# Patient Record
Sex: Female | Born: 1995 | Race: Black or African American | Hispanic: No | Marital: Single | Smoking: Never smoker
Health system: Southern US, Community
[De-identification: ages and names within clinical notes are randomized; demographics above are authoritative.]

---
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
## 2018-11-22 ENCOUNTER — Emergency Department
Admission: EM | Admit: 2018-11-22 | Discharge: 2018-11-22 | Disposition: A | Discharge status: Home / Self Care | Payer: Medicaid Other | Attending: Emergency Medicine | Admitting: Emergency Medicine

## 2018-11-22 ENCOUNTER — Other Ambulatory Visit: Payer: Self-pay

## 2018-11-22 ENCOUNTER — Encounter: Payer: Self-pay | Admitting: Emergency Medicine

## 2018-11-22 ENCOUNTER — Emergency Department: Payer: Medicaid Other

## 2018-11-22 DIAGNOSIS — M20011 Mallet finger of right finger(s): Secondary | ICD-10-CM | POA: Insufficient documentation

## 2018-11-22 DIAGNOSIS — M79644 Pain in right finger(s): Secondary | ICD-10-CM | POA: Diagnosis present

## 2018-11-22 MED ORDER — MELOXICAM 15 MG PO TABS: 15.0000 mg | ORAL_TABLET | Freq: Every day | ORAL | 1 refills | Status: AC
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
## 2018-11-22 NOTE — ED Triage Notes (Signed)
Pt concerned over pain to pt's right pinky finger.

## 2019-11-04 ENCOUNTER — Ambulatory Visit (HOSPITAL_COMMUNITY)
Admission: EM | Admit: 2019-11-04 | Discharge: 2019-11-04 | Disposition: A | Discharge status: Home / Self Care | Payer: Medicaid Other

## 2019-11-04 ENCOUNTER — Other Ambulatory Visit: Payer: Self-pay

## 2021-08-09 ENCOUNTER — Encounter: Payer: Self-pay | Admitting: *Deleted

## 2021-08-09 ENCOUNTER — Other Ambulatory Visit: Payer: Self-pay

## 2021-08-09 DIAGNOSIS — Z3A01 Less than 8 weeks gestation of pregnancy: Secondary | ICD-10-CM | POA: Insufficient documentation

## 2021-08-09 DIAGNOSIS — O99281 Endocrine, nutritional and metabolic diseases complicating pregnancy, first trimester: Secondary | ICD-10-CM | POA: Diagnosis not present

## 2021-08-09 DIAGNOSIS — O2341 Unspecified infection of urinary tract in pregnancy, first trimester: Secondary | ICD-10-CM | POA: Insufficient documentation

## 2021-08-09 DIAGNOSIS — O26891 Other specified pregnancy related conditions, first trimester: Secondary | ICD-10-CM | POA: Diagnosis present

## 2021-08-09 DIAGNOSIS — E860 Dehydration: Secondary | ICD-10-CM | POA: Insufficient documentation

## 2021-08-09 DIAGNOSIS — O219 Vomiting of pregnancy, unspecified: Secondary | ICD-10-CM | POA: Diagnosis not present

## 2021-08-09 DIAGNOSIS — R102 Pelvic and perineal pain: Secondary | ICD-10-CM | POA: Diagnosis not present

## 2021-08-09 DIAGNOSIS — R1084 Generalized abdominal pain: Secondary | ICD-10-CM | POA: Diagnosis not present

## 2021-08-09 MED ORDER — ONDANSETRON 4 MG PO TBDP
ORAL_TABLET | ORAL | Status: AC
  Administered 2021-08-10: 4 mg via ORAL
  Filled 2021-08-09: qty 1

## 2021-08-09 MED ORDER — ONDANSETRON 4 MG PO TBDP
4.0000 mg | ORAL_TABLET | Freq: Once | ORAL | Status: AC | PRN
Start: 2021-08-09 — End: 2021-08-10
  Administered 2021-08-09: 4 mg via ORAL

## 2021-08-09 NOTE — ED Notes (Signed)
Pt unable to void at this time. 

## 2021-08-09 NOTE — ED Triage Notes (Signed)
Pt brought in via ems from home with vomiting.  Pt is approx [redacted] weeks pregnant.  Pt has abd pain. No vag bleeding.  No dysuria.  Pt vomiting in triage.  [id number] ?

## 2021-08-10 ENCOUNTER — Emergency Department: Payer: Medicaid Other

## 2021-08-10 ENCOUNTER — Emergency Department
Admission: EM | Admit: 2021-08-10 | Discharge: 2021-08-10 | Disposition: A | Discharge status: Home / Self Care | Payer: Medicaid Other | Attending: Emergency Medicine | Admitting: Emergency Medicine

## 2021-08-10 DIAGNOSIS — R8281 Pyuria: Secondary | ICD-10-CM

## 2021-08-10 DIAGNOSIS — O26891 Other specified pregnancy related conditions, first trimester: Secondary | ICD-10-CM

## 2021-08-10 DIAGNOSIS — R112 Nausea with vomiting, unspecified: Secondary | ICD-10-CM

## 2021-08-10 LAB — URINALYSIS, ROUTINE W REFLEX MICROSCOPIC
Bacteria, UA: NONE SEEN
Bilirubin Urine: NEGATIVE
Glucose, UA: NEGATIVE mg/dL
Hgb urine dipstick: NEGATIVE
Ketones, ur: 80 mg/dL — AB
Nitrite: NEGATIVE
Protein, ur: 30 mg/dL — AB
Specific Gravity, Urine: 1.032 — ABNORMAL HIGH (ref 1.005–1.030)
pH: 5 (ref 5.0–8.0)

## 2021-08-10 LAB — CBC
HCT: 36.3 % (ref 36.0–46.0)
Hemoglobin: 12.1 g/dL (ref 12.0–15.0)
MCH: 27.8 pg (ref 26.0–34.0)
MCHC: 33.3 g/dL (ref 30.0–36.0)
MCV: 83.4 fL (ref 80.0–100.0)
Platelets: 300 10*3/uL (ref 150–400)
RBC: 4.35 MIL/uL (ref 3.87–5.11)
RDW: 11.8 % (ref 11.5–15.5)
WBC: 9.1 10*3/uL (ref 4.0–10.5)
nRBC: 0 % (ref 0.0–0.2)

## 2021-08-10 LAB — COMPREHENSIVE METABOLIC PANEL
ALT: 16 U/L (ref 0–44)
AST: 22 U/L (ref 15–41)
Albumin: 4.8 g/dL (ref 3.5–5.0)
Alkaline Phosphatase: 41 U/L (ref 38–126)
Anion gap: 10 (ref 5–15)
BUN: 10 mg/dL (ref 6–20)
CO2: 22 mmol/L (ref 22–32)
Calcium: 9.5 mg/dL (ref 8.9–10.3)
Chloride: 102 mmol/L (ref 98–111)
Creatinine, Ser: 0.71 mg/dL (ref 0.44–1.00)
GFR, Estimated: >60 mL/min (ref >60)
Glucose, Bld: 145 mg/dL — ABNORMAL HIGH (ref 70–99)
Potassium: 3.8 mmol/L (ref 3.5–5.1)
Sodium: 134 mmol/L — ABNORMAL LOW (ref 135–145)
Total Bilirubin: 0.7 mg/dL (ref 0.3–1.2)
Total Protein: 7.6 g/dL (ref 6.5–8.1)

## 2021-08-10 LAB — HCG, QUANTITATIVE, PREGNANCY: hCG, Beta Chain, Quant, S: 26427 m[IU]/mL — ABNORMAL HIGH (ref <5)

## 2021-08-10 LAB — LIPASE, BLOOD: Lipase: 27 U/L (ref 11–51)

## 2021-08-10 MED ORDER — CEFPODOXIME PROXETIL 100 MG PO TABS
100.0000 mg | ORAL_TABLET | Freq: Two times a day (BID) | ORAL | 0 refills | Status: AC
Start: 2021-08-10 — End: 2021-08-15

## 2021-08-10 MED ORDER — SODIUM CHLORIDE 0.9 % IV BOLUS (SEPSIS)
2000.0000 mL | Freq: Once | INTRAVENOUS | Status: AC
  Administered 2021-08-10: 2000 mL via INTRAVENOUS

## 2021-08-10 MED ORDER — ONDANSETRON HCL 4 MG PO TABS: 4.0000 mg | ORAL_TABLET | Freq: Three times a day (TID) | ORAL | 0 refills | Status: DC | PRN

## 2021-08-10 MED ORDER — METOCLOPRAMIDE HCL 5 MG/ML IJ SOLN
10.0000 mg | Freq: Once | INTRAMUSCULAR | Status: AC
  Administered 2021-08-10: 10 mg via INTRAVENOUS
  Filled 2021-08-10: qty 2

## 2021-08-10 MED ORDER — DIPHENHYDRAMINE HCL 50 MG/ML IJ SOLN
25.0000 mg | Freq: Once | INTRAMUSCULAR | Status: AC
  Administered 2021-08-10: 25 mg via INTRAVENOUS
  Filled 2021-08-10: qty 1

## 2021-08-10 MED ORDER — ACETAMINOPHEN 500 MG PO TABS
1000.0000 mg | ORAL_TABLET | Freq: Once | ORAL | Status: AC
  Administered 2021-08-10: 1000 mg via ORAL
  Filled 2021-08-10 (×2): qty 2

## 2021-08-10 MED ORDER — DOXYLAMINE-PYRIDOXINE 10-10 MG PO TBEC: DELAYED_RELEASE_TABLET | ORAL | 0 refills | Status: DC

## 2021-08-10 MED ORDER — ONDANSETRON 4 MG PO TBDP
ORAL_TABLET | ORAL | Status: AC
  Filled 2021-08-10: qty 1

## 2021-08-10 MED ORDER — SODIUM CHLORIDE 0.9 % IV SOLN
1.0000 g | Freq: Once | INTRAVENOUS | Status: AC
  Administered 2021-08-10: 1 g via INTRAVENOUS
  Filled 2021-08-10: qty 10

## 2021-08-10 NOTE — ED Notes (Signed)
PO challenge

## 2021-08-10 NOTE — ED Notes (Signed)
Spoke to [employer] about legal guardianship. She knows nothing about that. She did say she lives with her, and assists taking care of her. Grandmother was made aware of the d/c status. ?

## 2021-08-10 NOTE — ED Provider Notes (Signed)
Assumed care from Dr. Elesa Massed at 7 AM. Briefly, the patient is a 26 y.o. female with PMHx of  has no past medical history on file. here with lower abdominal pain. Pt is a G1P0 at estimated [redacted]w[redacted]d. Suspect likely viral GI vs food-borne illness, btu iven TTP on exam, MRI obtained for eval appendicitis, diverticulitis. Plan to f/u symptomatic control, imaging.  ? ?Labs Reviewed  ?COMPREHENSIVE METABOLIC PANEL - Abnormal; Notable for the following components:  ?    Result Value  ? Sodium 134 (*)   ? Glucose, Bld 145 (*)   ? All other components within normal limits  ?URINALYSIS, ROUTINE W REFLEX MICROSCOPIC - Abnormal; Notable for the following components:  ? Color, Urine YELLOW (*)   ? APPearance HAZY (*)   ? Specific Gravity, Urine 1.032 (*)   ? Ketones, ur 80 (*)   ? Protein, ur 30 (*)   ? Leukocytes,Ua TRACE (*)   ? All other components within normal limits  ?HCG, QUANTITATIVE, PREGNANCY - Abnormal; Notable for the following components:  ? hCG, Beta Francene Finders 26,427 (*)   ? All other components within normal limits  ?URINE CULTURE  ?LIPASE, BLOOD  ?CBC  ? ? ?Course of Care: ?MRI reviewed, shows cecal tip in the right pelvis.  There is no appreciable edema or inflammation in the area.  On my exam, patient has no significant right lower quadrant tenderness.  Lab work shows some mild dehydration but is overall reassuring.  No leukocytosis.  UA does show some mild pyuria.  Will treat for this, though I suspect this is more so related to her dehydration.  Urine culture has been sent.  Rocephin given.  Following IV fluids, patient feels much better and is tolerating p.o.  Will treat for nausea and vomiting possibly related to pregnancy versus viral GI illness, treat for possible UTI, and discharged with outpatient follow-up. ? ? ? ?  ?Shaune Pollack, MD ?08/10/21 [phone] ? ?

## 2021-08-10 NOTE — Discharge Instructions (Addendum)
Based on your ultrasound, your current gestational age is 5 weeks 6 days. ? ?This makes your estimated due date 04/06/2022. ? ?Take the medications as prescribed.  I recommend taking a prenatal multivitamin as well. ? ?Follow-up with your OB/GYN tomorrow. ?

## 2021-08-10 NOTE — ED Provider Notes (Signed)
? ?[facility] ?Provider Note ? ? ? Event Date/Time  ? First MD Initiated Contact with Patient 08/10/21 0544   ?  (approximate) ? ? ?History  ? ?Emesis ? ? ?HPI ? ?Laurie Rosales is a 25 y.o. female G1, P0 who presents to the emergency department with diffuse abdominal pain worse in the lower abdomen, nausea and vomiting, diarrhea that started yesterday.  She is approximately [redacted] weeks pregnant with an LMP of 06/28/2021.  Denies dysuria, hematuria, vaginal bleeding or discharge.  No sick contacts or recent travel.  No previous abdominal surgery. ? ? ?History provided by patient. ? ? ? ?No past medical history on file. ? ?No past surgical history on file. ? ?MEDICATIONS:  ?Prior to Admission medications   ?Medication Sig Start Date End Date Taking? Authorizing Provider  ?ondansetron (ZOFRAN) 4 MG tablet Take 1 tablet (4 mg total) by mouth every 8 (eight) hours as needed for nausea or vomiting. 04/22/15   Jennye Moccasin, MD  ? ? ?Physical Exam  ? ?Triage Vital Signs: ?ED Triage Vitals  ?Enc Vitals Group  ?   BP 08/09/21 2344 128/77  ?   Pulse Rate 08/09/21 2344 78  ?   Resp 08/09/21 2344 (!) 22  ?   Temp 08/09/21 2344 97.7 ?F (36.5 ?C)  ?   Temp Source 08/09/21 2344 Oral  ?   SpO2 08/09/21 2344 100 %  ?   Weight 08/09/21 2341 122 lb (55.3 kg)  ?   Height 08/09/21 2341 5\' 4"  (1.626 m)  ?   Head Circumference --   ?   Peak Flow --   ?   Pain Score 08/09/21 2341 6  ?   Pain Loc --   ?   Pain Edu? --   ?   Excl. in GC? --   ? ? ?Most recent vital signs: ?Vitals:  ? 08/09/21 2344 08/10/21 0351  ?BP: 128/77 90/75  ?Pulse: 78 95  ?Resp: (!) 22 20  ?Temp: 97.7 ?F (36.5 ?C) 98.8 ?F (37.1 ?C)  ?SpO2: 100% 100%  ? ? ?CONSTITUTIONAL: Alert and oriented and responds appropriately to questions. Well-appearing; well-nourished ?HEAD: Normocephalic, atraumatic ?EYES: Conjunctivae clear, pupils appear equal, sclera nonicteric ?ENT: normal nose; moist mucous membranes ?NECK: Supple, normal ROM ?CARD: RRR; S1 and S2  appreciated; no murmurs, no clicks, no rubs, no gallops ?RESP: Normal chest excursion without splinting or tachypnea; breath sounds clear and equal bilaterally; no wheezes, no rhonchi, no rales, no hypoxia or respiratory distress, speaking full sentences ?ABD/GI: Normal bowel sounds; non-distended; soft, tender to palpation diffusely worse in the left lower quadrant and right lower quadrant  ?BACK: The back appears normal ?EXT: Normal ROM in all joints; no deformity noted, no edema; no cyanosis ?SKIN: Normal color for age and race; warm; no rash on exposed skin ?NEURO: Moves all extremities equally, normal speech ?PSYCH: The patient's mood and manner are appropriate. ? ? ?ED Results / Procedures / Treatments  ? ?LABS: ?(all labs ordered are listed, but only abnormal results are displayed) ?Labs Reviewed  ?COMPREHENSIVE METABOLIC PANEL - Abnormal; Notable for the following components:  ?    Result Value  ? Sodium 134 (*)   ? Glucose, Bld 145 (*)   ? All other components within normal limits  ?URINALYSIS, ROUTINE W REFLEX MICROSCOPIC - Abnormal; Notable for the following components:  ? Color, Urine YELLOW (*)   ? APPearance HAZY (*)   ? Specific Gravity, Urine 1.032 (*)   ?  Ketones, ur 80 (*)   ? Protein, ur 30 (*)   ? Leukocytes,Ua TRACE (*)   ? All other components within normal limits  ?HCG, QUANTITATIVE, PREGNANCY - Abnormal; Notable for the following components:  ? hCG, Beta Francene FindersChain, Quant, S 26,427 (*)   ? All other components within normal limits  ?URINE CULTURE  ?LIPASE, BLOOD  ?CBC  ? ? ? ?EKG: ? ?RADIOLOGY: ?My personal review and interpretation of imaging: Transvaginal ultrasound shows single living intrauterine pregnancy with normal fetal heart rate. ? ?I have personally reviewed all radiology reports.   ?US OB LESS THAN 14 WEEKS W/ OB TRANSVAGINAL AND DOPPLER ? ?Result Date: 08/10/2021 ?CLINICAL DATA:  Pelvic pain, pregnant, LMP 06/28/2021 EXAM: OBSTETRIC <14 WK US AND TRANSVAGINAL OB US DOPPLER ULTRASOUND  OF OVARIES TECHNIQUE: Both transabdominal and transvaginal ultrasound examinations were performed for complete evaluation of the gestation as well as the maternal uterus, adnexal regions, and pelvic cul-de-sac. Transvaginal technique was performed to assess early pregnancy. Color and duplex Doppler ultrasound was utilized to evaluate blood flow to the ovaries. COMPARISON:  None. FINDINGS: Intrauterine gestational sac: Single Yolk sac:  Present, single, normal appearing Embryo:  Present, single Cardiac Activity: Present, regular Heart Rate: 95 bpm CRL:   3 mm   5 w 6 d                  US EDC: 04/06/2022 Subchorionic hemorrhage:  None visualized. Maternal uterus/adnexae: The uterus is anteverted. The cervix is unremarkable. No intrauterine masses are seen. The maternal ovaries are normal in size and echogenicity. Corpus luteum noted within the left ovary. No adnexal masses are seen. Mild simple appearing free fluid is seen within the cul-de-sac. Pulsed Doppler evaluation of both ovaries demonstrates normal appearing low-resistance arterial and venous waveforms. IMPRESSION: Single living intrauterine gestation with an estimated gestational age of [redacted] weeks, 6 days. Normal vascularity of the maternal ovaries bilaterally. No acute abnormality. Electronically Signed   By: Helyn NumbersAshesh  Parikh M.D.   On: 08/10/2021 02:20   ? ? ?PROCEDURES: ? ?Critical Care performed: No ? ? ? ?Procedures ? ? ? ?IMPRESSION / MDM / ASSESSMENT AND PLAN / ED COURSE  ?I reviewed the triage vital signs and the nursing notes. ? ? ? ?Patient here with complaints of nausea, vomiting diarrhea and diffuse abdominal pain worse in the right and left lower quadrants.  She is currently pregnant. ? ? ? ?DIFFERENTIAL DIAGNOSIS (includes but not limited to):   Viral gastroenteritis, appendicitis, diverticulitis, colitis, UTI, ectopic pregnancy, torsion, TOA, STI ? ? ?PLAN: We will obtain CBC, CMP, lipase, urinalysis, transvaginal ultrasound with Doppler.  Received  Zofran in triage which she states did help her symptoms but now they are coming back.  Will give IV fluids, Reglan, Tylenol. ? ? ?MEDICATIONS GIVEN IN ED: ?Medications  ?ondansetron (ZOFRAN-ODT) 4 MG disintegrating tablet (has no administration in time range)  ?sodium chloride 0.9 % bolus 2,000 mL (has no administration in time range)  ?acetaminophen (TYLENOL) tablet 1,000 mg (has no administration in time range)  ?metoCLOPramide (REGLAN) injection 10 mg (has no administration in time range)  ?cefTRIAXone (ROCEPHIN) 1 g in sodium chloride 0.9 % 100 mL IVPB (has no administration in time range)  ?ondansetron (ZOFRAN-ODT) disintegrating tablet 4 mg (4 mg Oral Given 08/09/21 2355)  ? ? ? ?ED COURSE: Patient's labs reassuring.  No leukocytosis.  Normal electrolytes, renal function, LFTs and lipase.  She does have trace leukocytes esterase and 11-20 white blood cells but no bacteria.  We will add on a urine culture but given she is pregnant and has pyuria, will cover with antibiotics.  OB ultrasound reviewed by myself and radiologist and shows a single intrauterine gestation with normal fetal heart tone.  Still complaining of abdominal pain.  She is tender at McBurney's point as well as in the left lower quadrant.  Will obtain MRI of the abdomen pelvis without contrast.  Signed out the oncoming ED physician. ? ? ?I reviewed all nursing notes and pertinent previous records as available.  I have reviewed and interpreted any and all EKGs, lab and urine results, imaging and radiology reports (as available). ? ? ? ?CONSULTS: Pending after MRI results. ? ? ?OUTSIDE RECORDS REVIEWED: Reviewed patient's recent office visit from [facility] Ahr on 03/29/2021. ? ? ? ? ? ? ? ? ?FINAL CLINICAL IMPRESSION(S) / ED DIAGNOSES  ? ?Final diagnoses:  ?Nausea vomiting and diarrhea  ?Abdominal pain during pregnancy in first trimester  ?Pyuria  ? ? ? ?Rx / DC Orders  ? ?ED Discharge Orders   ? ? None  ? ?  ? ? ? ?Note:  This document was prepared  using Dragon voice recognition software and may include unintentional dictation errors. ?  ?Aqib Lough, Layla Maw, DO ?08/10/21 7035 ? ?

## 2021-08-10 NOTE — ED Notes (Signed)
Pt ambulated to restroom without assistance.

## 2021-08-11 LAB — URINE CULTURE: Culture: <10000 — AB

## 2021-08-30 ENCOUNTER — Other Ambulatory Visit: Payer: Self-pay

## 2021-08-30 ENCOUNTER — Emergency Department
Admission: EM | Admit: 2021-08-30 | Discharge: 2021-08-30 | Disposition: A | Discharge status: Home / Self Care | Payer: Medicaid Other | Attending: Emergency Medicine | Admitting: Emergency Medicine

## 2021-08-30 DIAGNOSIS — Z3A09 9 weeks gestation of pregnancy: Secondary | ICD-10-CM | POA: Insufficient documentation

## 2021-08-30 DIAGNOSIS — R7309 Other abnormal glucose: Secondary | ICD-10-CM | POA: Diagnosis not present

## 2021-08-30 DIAGNOSIS — O219 Vomiting of pregnancy, unspecified: Secondary | ICD-10-CM | POA: Diagnosis present

## 2021-08-30 LAB — CBC WITH DIFFERENTIAL/PLATELET
Abs Immature Granulocytes: 0.04 10*3/uL (ref 0.00–0.07)
Basophils Absolute: 0 10*3/uL (ref 0.0–0.1)
Basophils Relative: 0 %
Eosinophils Absolute: 0 10*3/uL (ref 0.0–0.5)
Eosinophils Relative: 0 %
HCT: 40.1 % (ref 36.0–46.0)
Hemoglobin: 13.7 g/dL (ref 12.0–15.0)
Immature Granulocytes: 0 %
Lymphocytes Relative: 12 %
Lymphs Abs: 1.3 10*3/uL (ref 0.7–4.0)
MCH: 28.7 pg (ref 26.0–34.0)
MCHC: 34.2 g/dL (ref 30.0–36.0)
MCV: 84.1 fL (ref 80.0–100.0)
Monocytes Absolute: 0.7 10*3/uL (ref 0.1–1.0)
Monocytes Relative: 7 %
Neutro Abs: 8.5 10*3/uL — ABNORMAL HIGH (ref 1.7–7.7)
Neutrophils Relative %: 81 %
Platelets: 388 10*3/uL (ref 150–400)
RBC: 4.77 MIL/uL (ref 3.87–5.11)
RDW: 12.2 % (ref 11.5–15.5)
WBC: 10.6 10*3/uL — ABNORMAL HIGH (ref 4.0–10.5)
nRBC: 0 % (ref 0.0–0.2)

## 2021-08-30 LAB — URINALYSIS, ROUTINE W REFLEX MICROSCOPIC
Bilirubin Urine: NEGATIVE
Glucose, UA: >=500 mg/dL — AB
Hgb urine dipstick: NEGATIVE
Ketones, ur: 80 mg/dL — AB
Leukocytes,Ua: NEGATIVE
Nitrite: NEGATIVE
Protein, ur: 30 mg/dL — AB
Specific Gravity, Urine: 1.036 — ABNORMAL HIGH (ref 1.005–1.030)
pH: 6 (ref 5.0–8.0)

## 2021-08-30 LAB — COMPREHENSIVE METABOLIC PANEL
ALT: 35 U/L (ref 0–44)
AST: 34 U/L (ref 15–41)
Albumin: 4.9 g/dL (ref 3.5–5.0)
Alkaline Phosphatase: 41 U/L (ref 38–126)
Anion gap: 12 (ref 5–15)
BUN: 13 mg/dL (ref 6–20)
CO2: 25 mmol/L (ref 22–32)
Calcium: 10.5 mg/dL — ABNORMAL HIGH (ref 8.9–10.3)
Chloride: 100 mmol/L (ref 98–111)
Creatinine, Ser: 0.79 mg/dL (ref 0.44–1.00)
GFR, Estimated: >60 mL/min (ref >60)
Glucose, Bld: 142 mg/dL — ABNORMAL HIGH (ref 70–99)
Potassium: 3.6 mmol/L (ref 3.5–5.1)
Sodium: 137 mmol/L (ref 135–145)
Total Bilirubin: 0.8 mg/dL (ref 0.3–1.2)
Total Protein: 8.7 g/dL — ABNORMAL HIGH (ref 6.5–8.1)

## 2021-08-30 LAB — POC URINE PREG, ED: Preg Test, Ur: POSITIVE — AB

## 2021-08-30 LAB — LIPASE, BLOOD: Lipase: 33 U/L (ref 11–51)

## 2021-08-30 LAB — CBG MONITORING, ED: Glucose-Capillary: 134 mg/dL — ABNORMAL HIGH (ref 70–99)

## 2021-08-30 MED ORDER — DEXTROSE-NACL 5-0.45 % IV SOLN
Freq: Once | INTRAVENOUS | Status: AC
Start: 2021-08-30 — End: 2021-08-30

## 2021-08-30 MED ORDER — ACETAMINOPHEN 325 MG PO TABS
650.0000 mg | ORAL_TABLET | Freq: Once | ORAL | Status: AC
  Administered 2021-08-30: 650 mg via ORAL
  Filled 2021-08-30: qty 2

## 2021-08-30 MED ORDER — CEFTRIAXONE SODIUM 1 G IJ SOLR
1.0000 g | Freq: Once | INTRAMUSCULAR | Status: AC
  Administered 2021-08-30: 1 g via INTRAVENOUS
  Filled 2021-08-30: qty 10

## 2021-08-30 MED ORDER — PROMETHAZINE HCL 12.5 MG PO TABS: 12.5000 mg | ORAL_TABLET | Freq: Four times a day (QID) | ORAL | 0 refills | Status: DC | PRN

## 2021-08-30 MED ORDER — LACTATED RINGERS IV BOLUS
1000.0000 mL | Freq: Once | INTRAVENOUS | Status: AC
Start: 2021-08-30 — End: 2021-08-30
  Administered 2021-08-30: 1000 mL via INTRAVENOUS

## 2021-08-30 MED ORDER — METOCLOPRAMIDE HCL 5 MG/ML IJ SOLN
5.0000 mg | Freq: Once | INTRAMUSCULAR | Status: AC
  Administered 2021-08-30: 5 mg via INTRAVENOUS
  Filled 2021-08-30: qty 2

## 2021-08-30 MED ORDER — CEPHALEXIN 500 MG PO CAPS: 500.0000 mg | ORAL_CAPSULE | Freq: Three times a day (TID) | ORAL | 0 refills | Status: AC

## 2021-08-30 NOTE — ED Notes (Signed)
Attempted to get urine sample twice. Unsuccessful. ?

## 2021-08-30 NOTE — ED Provider Notes (Signed)
? ?[facility] ?Provider Note ? ? ? Event Date/Time  ? First MD Initiated Contact with Patient 08/30/21 [phone]   ?  (approximate) ? ? ?History  ? ?Emesis During Pregnancy ? ? ?HPI ? ?Laurie Rosales is a 26 y.o. female who comes from home reporting vomiting all day.  She has had Diclegis.  Patient was prescribed Zofran but did not get it filled.  She has not taken any Phenergan. ? ?  ? ? ?Physical Exam  ? ?Triage Vital Signs: ?ED Triage Vitals  ?Enc Vitals Group  ?   BP 08/30/21 0615 (!) 122/104  ?   Pulse Rate 08/30/21 0615 (!) 114  ?   Resp 08/30/21 0615 20  ?   Temp 08/30/21 0615 98.5 ?F (36.9 ?C)  ?   Temp Source 08/30/21 0615 Oral  ?   SpO2 08/30/21 0615 100 %  ?   Weight 08/30/21 0613 117 lb 8 oz (53.3 kg)  ?   Height 08/30/21 0613 5\' 4"  (1.626 m)  ?   Head Circumference --   ?   Peak Flow --   ?   Pain Score 08/30/21 0613 0  ?   Pain Loc --   ?   Pain Edu? --   ?   Excl. in GC? --   ? ? ?Most recent vital signs: ?Vitals:  ? 08/30/21 0700 08/30/21 0730  ?BP: 107/67 101/63  ?Pulse: 78 85  ?Resp: 16 14  ?Temp:    ?SpO2: 100% 99%  ? ? ? ?General: Initially sleeping but then awake, no distress.  ?CV:  Good peripheral perfusion.  ?Resp:  Normal effort.  Lungs are clear ?Abd:  No distention.  Soft nontender ?Extremities with no edema ? ?ED Results / Procedures / Treatments  ? ?Labs ?(all labs ordered are listed, but only abnormal results are displayed) ?Labs Reviewed  ?CBC WITH DIFFERENTIAL/PLATELET - Abnormal; Notable for the following components:  ?    Result Value  ? WBC 10.6 (*)   ? Neutro Abs 8.5 (*)   ? All other components within normal limits  ?COMPREHENSIVE METABOLIC PANEL - Abnormal; Notable for the following components:  ? Glucose, Bld 142 (*)   ? Calcium 10.5 (*)   ? Total Protein 8.7 (*)   ? All other components within normal limits  ?URINALYSIS, ROUTINE W REFLEX MICROSCOPIC - Abnormal; Notable for the following components:  ? Color, Urine YELLOW (*)   ? APPearance HAZY (*)   ?  Specific Gravity, Urine 1.036 (*)   ? Glucose, UA >=500 (*)   ? Ketones, ur 80 (*)   ? Protein, ur 30 (*)   ? Bacteria, UA MANY (*)   ? All other components within normal limits  ?POC URINE PREG, ED - Abnormal; Notable for the following components:  ? Preg Test, Ur POSITIVE (*)   ? All other components within normal limits  ?CBG MONITORING, ED - Abnormal; Notable for the following components:  ? Glucose-Capillary 134 (*)   ? All other components within normal limits  ?URINE CULTURE  ?LIPASE, BLOOD  ? ? ? ?EKG ? ? ? ? ?RADIOLOGY ? ? ? ?PROCEDURES: ? ?Critical Care performed:  ? ?Procedures ? ? ?MEDICATIONS ORDERED IN ED: ?Medications  ?cefTRIAXone (ROCEPHIN) 1 g in sodium chloride 0.9 % 100 mL IVPB (has no administration in time range)  ?dextrose 5 %-0.45 % sodium chloride infusion (0 mLs Intravenous Stopped 08/30/21 0752)  ?metoCLOPramide (REGLAN) injection 5 mg (5 mg Intravenous Given  08/30/21 2585)  ?acetaminophen (TYLENOL) tablet 650 mg (650 mg Oral Given 08/30/21 0755)  ?lactated ringers bolus 1,000 mL (1,000 mLs Intravenous New Bag/Given 08/30/21 0757)  ? ? ? ?IMPRESSION / MDM / ASSESSMENT AND PLAN / ED COURSE  ?I reviewed the triage vital signs and the nursing notes. ?----------------------------------------- ?9:00 AM on 08/30/2021 ?----------------------------------------- ?Patient doing well now she is able to tolerate p.o. liquids.  I will let her go with clear liquids for a while and then the brat diet and then small amounts of her regular food.  She looks like she may have a UTI.  I sent a urine culture.  We will give her some Keflex 3 times a day for that as well.  I gave her prescription for Phenergan just in case she does not use the Zofran.  Or if the Zofran does not work.  She will follow-up with her doctor she will return if she has any further problems with vomiting. ? ? ?The patient is on the cardiac monitor to evaluate for evidence of arrhythmia and/or significant heart rate changes.  None were seen ? ?   ? ? ?FINAL CLINICAL IMPRESSION(S) / ED DIAGNOSES  ? ?Final diagnoses:  ?Excessive vomiting in pregnancy  ? ? ? ?Rx / DC Orders  ? ?ED Discharge Orders   ? ?      Ordered  ?  cephALEXin (KEFLEX) 500 MG capsule  3 times daily       ? 08/30/21 0859  ?  promethazine (PHENERGAN) 12.5 MG tablet  Every 6 hours PRN       ? 08/30/21 0859  ? ?  ?  ? ?  ? ? ? ?Note:  This document was prepared using Dragon voice recognition software and may include unintentional dictation errors. ?  ?Arnaldo Natal, MD ?08/30/21 0901 ? ?

## 2021-08-30 NOTE — Discharge Instructions (Addendum)
Take the antibiotic Keflex 1 pill 3 times a day for possible UTI.  You can try the Phenergan 1 pill 4 times a day for nausea or the Zofran melt on your tongue wafers that you were prescribed by your other doctor.  Only use 1 or the other not both together.  For now drink small amounts of clear liquids frequently for 3 to 4 hours.  Just sip like a teaspoon at a time every few minutes.  Clear liquids include chicken broth weak tea flat soda fruit juice but not orange juice Jell-O and popsicles.  After that try that brat diet which is bananas rice applesauce toast and crackers nibble at those for several hours.  If you are doing okay with that he can try eating a regular food but still only small amounts frequently.  It may be that you have to stick to the brat type diet for the next few days.  If you have more trouble with vomiting and you cannot keep anything down please do not hesitate to return here. ?

## 2021-08-30 NOTE — ED Triage Notes (Signed)
BIB [facility] from home due to emesis during pregnancy. Approx [redacted] weeks pregnant. Prescribed phenergan and Zofran outpatient, states not helping. Reports emesis "all day" since 8AM yesterday. ?

## 2021-08-31 LAB — URINE CULTURE

## 2021-09-30 ENCOUNTER — Emergency Department
Admission: EM | Admit: 2021-09-30 | Discharge: 2021-09-30 | Discharge status: Against Medical Advice | Payer: Medicaid Other | Attending: Emergency Medicine | Admitting: Emergency Medicine

## 2021-09-30 ENCOUNTER — Emergency Department: Payer: Medicaid Other

## 2021-09-30 ENCOUNTER — Other Ambulatory Visit: Payer: Self-pay

## 2021-09-30 DIAGNOSIS — O99611 Diseases of the digestive system complicating pregnancy, first trimester: Secondary | ICD-10-CM | POA: Insufficient documentation

## 2021-09-30 DIAGNOSIS — R109 Unspecified abdominal pain: Secondary | ICD-10-CM | POA: Diagnosis not present

## 2021-09-30 DIAGNOSIS — R112 Nausea with vomiting, unspecified: Secondary | ICD-10-CM | POA: Diagnosis not present

## 2021-09-30 DIAGNOSIS — R102 Pelvic and perineal pain: Secondary | ICD-10-CM

## 2021-09-30 LAB — PREGNANCY, URINE: Preg Test, Ur: POSITIVE — AB

## 2021-09-30 LAB — COMPREHENSIVE METABOLIC PANEL
ALT: 27 U/L (ref 0–44)
AST: 21 U/L (ref 15–41)
Albumin: 4.7 g/dL (ref 3.5–5.0)
Alkaline Phosphatase: 37 U/L — ABNORMAL LOW (ref 38–126)
Anion gap: 8 (ref 5–15)
BUN: 10 mg/dL (ref 6–20)
CO2: 24 mmol/L (ref 22–32)
Calcium: 9.7 mg/dL (ref 8.9–10.3)
Chloride: 103 mmol/L (ref 98–111)
Creatinine, Ser: 0.46 mg/dL (ref 0.44–1.00)
GFR, Estimated: >60 mL/min (ref >60)
Glucose, Bld: 121 mg/dL — ABNORMAL HIGH (ref 70–99)
Potassium: 3.7 mmol/L (ref 3.5–5.1)
Sodium: 135 mmol/L (ref 135–145)
Total Bilirubin: 0.6 mg/dL (ref 0.3–1.2)
Total Protein: 8.2 g/dL — ABNORMAL HIGH (ref 6.5–8.1)

## 2021-09-30 LAB — CBC
HCT: 36.4 % (ref 36.0–46.0)
Hemoglobin: 12.4 g/dL (ref 12.0–15.0)
MCH: 29 pg (ref 26.0–34.0)
MCHC: 34.1 g/dL (ref 30.0–36.0)
MCV: 85 fL (ref 80.0–100.0)
Platelets: 319 10*3/uL (ref 150–400)
RBC: 4.28 MIL/uL (ref 3.87–5.11)
RDW: 12.9 % (ref 11.5–15.5)
WBC: 9.7 10*3/uL (ref 4.0–10.5)
nRBC: 0 % (ref 0.0–0.2)

## 2021-09-30 LAB — URINALYSIS, ROUTINE W REFLEX MICROSCOPIC
Bilirubin Urine: NEGATIVE
Glucose, UA: 50 mg/dL — AB
Hgb urine dipstick: NEGATIVE
Ketones, ur: 80 mg/dL — AB
Nitrite: NEGATIVE
Protein, ur: 100 mg/dL — AB
Specific Gravity, Urine: 1.033 — ABNORMAL HIGH (ref 1.005–1.030)
pH: 5 (ref 5.0–8.0)

## 2021-09-30 LAB — LIPASE, BLOOD: Lipase: 23 U/L (ref 11–51)

## 2021-09-30 LAB — HCG, QUANTITATIVE, PREGNANCY: hCG, Beta Chain, Quant, S: 42254 m[IU]/mL — ABNORMAL HIGH (ref <5)

## 2021-09-30 NOTE — ED Notes (Signed)
20GA SL to rt wrist removed at pt request--cannula intact, dsng applied

## 2021-09-30 NOTE — ED Triage Notes (Addendum)
Pt arrives with c/o n/v since 5am. Per pt, she is having ABD discomfort. Pt denies bleeding. Pt is currently [redacted] weeks pregnant.

## 2021-10-06 NOTE — Congregational Nurse Program (Signed)
  Dept: [facility] Nurse Program Note  Date of Encounter: 10/06/2021  Past Medical History: No past medical history on file.  Encounter Details:  CNP Questionnaire - 10/06/21 1330       Questionnaire   Do you give verbal consent to treat you today? Yes    Location Patient Served  Not Applicable    Visit Setting Church or Organization    Patient Status Unknown    Insurance [employer]    Insurance Referral N/A    Medication N/A    Medical Provider Yes    Screening Referrals N/A    Medical Referral ED;Non-[facility] PCP/Clinic    Medical Appointment Made N/A    Food Have Food Insecurities    Transportation N/A    Housing/Utilities N/A    Interpersonal Safety N/A    Intervention Blood pressure;Educate;Navigate Healthcare System;Counsel    ED Visit Averted N/A   referred to ED per call to [facility] provider   Life-Saving Intervention Made N/A               Dept: [phone]   Congregational Nurse Program Note  Date of Encounter: 10/06/2021  Past Medical History: No past medical history on file.  Encounter Details:  [facility] due to extreme [facility]. Agrees to services. HIPAA and confidentiality discussed. 14 week premagravida who reports being transported 5 times to ED since becoming pregnant due to vomiting. Also has UTI but is having trouble taking the meds as prescribed; attempts to take at least once daily.  BP 139/97; repeated in 5 min 132/96. Client states she hasn't been able to keep any food down since she started vomiting last Wednesday. States was seen in ED 6/8 and preliminary labs and U/S were completed but she left without being seen by doctor since ED was so full. Provided water, gatorade, saltines and education re importance of avoiding dehydration. Has appointment scheduled tomorrow at [facility]. Assisted with call to [facility] provider re today's findings. Alex, LPN instructed client to seek ED eval today due to  possible dehydration and poor treatment of UTI. Co. Re importance of following instruction to client, her mother and her boyfriend. Support team acknowledged info but didn't sound like they were going to go since she has an appointment in the morning. Reinforced important of drinking small amounts of fluids frequently and importance of following medical advise. [id number]

## 2021-11-02 ENCOUNTER — Other Ambulatory Visit: Payer: Self-pay

## 2021-11-02 ENCOUNTER — Emergency Department
Admission: EM | Admit: 2021-11-02 | Discharge: 2021-11-02 | Disposition: A | Discharge status: Home / Self Care | Payer: Medicaid Other | Attending: Emergency Medicine | Admitting: Emergency Medicine

## 2021-11-02 DIAGNOSIS — D72829 Elevated white blood cell count, unspecified: Secondary | ICD-10-CM | POA: Insufficient documentation

## 2021-11-02 DIAGNOSIS — O99282 Endocrine, nutritional and metabolic diseases complicating pregnancy, second trimester: Secondary | ICD-10-CM | POA: Diagnosis not present

## 2021-11-02 DIAGNOSIS — O219 Vomiting of pregnancy, unspecified: Secondary | ICD-10-CM | POA: Diagnosis not present

## 2021-11-02 DIAGNOSIS — N3000 Acute cystitis without hematuria: Secondary | ICD-10-CM | POA: Diagnosis not present

## 2021-11-02 DIAGNOSIS — O2392 Unspecified genitourinary tract infection in pregnancy, second trimester: Secondary | ICD-10-CM | POA: Diagnosis not present

## 2021-11-02 DIAGNOSIS — Z3A17 17 weeks gestation of pregnancy: Secondary | ICD-10-CM | POA: Diagnosis not present

## 2021-11-02 DIAGNOSIS — E876 Hypokalemia: Secondary | ICD-10-CM | POA: Insufficient documentation

## 2021-11-02 DIAGNOSIS — E860 Dehydration: Secondary | ICD-10-CM | POA: Diagnosis not present

## 2021-11-02 LAB — CBC WITH DIFFERENTIAL/PLATELET
Abs Immature Granulocytes: 0.06 10*3/uL (ref 0.00–0.07)
Basophils Absolute: 0 10*3/uL (ref 0.0–0.1)
Basophils Relative: 0 %
Eosinophils Absolute: 0 10*3/uL (ref 0.0–0.5)
Eosinophils Relative: 0 %
HCT: 36.6 % (ref 36.0–46.0)
Hemoglobin: 12.5 g/dL (ref 12.0–15.0)
Immature Granulocytes: 0 %
Lymphocytes Relative: 14 %
Lymphs Abs: 1.8 10*3/uL (ref 0.7–4.0)
MCH: 28.7 pg (ref 26.0–34.0)
MCHC: 34.2 g/dL (ref 30.0–36.0)
MCV: 84.1 fL (ref 80.0–100.0)
Monocytes Absolute: 0.9 10*3/uL (ref 0.1–1.0)
Monocytes Relative: 6 %
Neutro Abs: 10.8 10*3/uL — ABNORMAL HIGH (ref 1.7–7.7)
Neutrophils Relative %: 80 %
Platelets: 397 10*3/uL (ref 150–400)
RBC: 4.35 MIL/uL (ref 3.87–5.11)
RDW: 12.5 % (ref 11.5–15.5)
WBC: 13.5 10*3/uL — ABNORMAL HIGH (ref 4.0–10.5)
nRBC: 0 % (ref 0.0–0.2)

## 2021-11-02 LAB — COMPREHENSIVE METABOLIC PANEL
ALT: 19 U/L (ref 0–44)
AST: 25 U/L (ref 15–41)
Albumin: 4.6 g/dL (ref 3.5–5.0)
Alkaline Phosphatase: 38 U/L (ref 38–126)
Anion gap: 11 (ref 5–15)
BUN: 10 mg/dL (ref 6–20)
CO2: 25 mmol/L (ref 22–32)
Calcium: 9.9 mg/dL (ref 8.9–10.3)
Chloride: 101 mmol/L (ref 98–111)
Creatinine, Ser: 0.68 mg/dL (ref 0.44–1.00)
GFR, Estimated: >60 mL/min (ref >60)
Glucose, Bld: 123 mg/dL — ABNORMAL HIGH (ref 70–99)
Potassium: 3.1 mmol/L — ABNORMAL LOW (ref 3.5–5.1)
Sodium: 137 mmol/L (ref 135–145)
Total Bilirubin: 1 mg/dL (ref 0.3–1.2)
Total Protein: 8.1 g/dL (ref 6.5–8.1)

## 2021-11-02 LAB — URINALYSIS, ROUTINE W REFLEX MICROSCOPIC
Bilirubin Urine: NEGATIVE
Glucose, UA: 50 mg/dL — AB
Hgb urine dipstick: NEGATIVE
Ketones, ur: 80 mg/dL — AB
Nitrite: NEGATIVE
Protein, ur: 100 mg/dL — AB
Specific Gravity, Urine: 1.033 — ABNORMAL HIGH (ref 1.005–1.030)
pH: 6 (ref 5.0–8.0)

## 2021-11-02 LAB — MAGNESIUM: Magnesium: 1.9 mg/dL (ref 1.7–2.4)

## 2021-11-02 LAB — POC URINE PREG, ED: Preg Test, Ur: POSITIVE — AB

## 2021-11-02 LAB — HCG, QUANTITATIVE, PREGNANCY: hCG, Beta Chain, Quant, S: 14444 m[IU]/mL — ABNORMAL HIGH (ref <5)

## 2021-11-02 LAB — LIPASE, BLOOD: Lipase: 28 U/L (ref 11–51)

## 2021-11-02 MED ORDER — PYRIDOXINE HCL 100 MG/ML IJ SOLN: 100.0000 mg | Freq: Once | INTRAMUSCULAR | Status: DC

## 2021-11-02 MED ORDER — CEPHALEXIN 250 MG/5ML PO SUSR: 500.0000 mg | Freq: Four times a day (QID) | ORAL | 0 refills | Status: AC

## 2021-11-02 MED ORDER — DOXYLAMINE-PYRIDOXINE 10-10 MG PO TBEC: DELAYED_RELEASE_TABLET | ORAL | 0 refills | Status: AC

## 2021-11-02 MED ORDER — POTASSIUM CHLORIDE CRYS ER 20 MEQ PO TBCR: 20.0000 meq | EXTENDED_RELEASE_TABLET | Freq: Two times a day (BID) | ORAL | 0 refills | Status: AC

## 2021-11-02 MED ORDER — PANTOPRAZOLE SODIUM 40 MG IV SOLR
40.0000 mg | Freq: Once | INTRAVENOUS | Status: AC
  Administered 2021-11-02: 40 mg via INTRAVENOUS
  Filled 2021-11-02: qty 10

## 2021-11-02 MED ORDER — VITAMIN B-6 50 MG PO TABS
100.0000 mg | ORAL_TABLET | Freq: Every day | ORAL | Status: AC
Start: 2021-11-02 — End: 2021-11-02
  Administered 2021-11-02: 100 mg via ORAL
  Filled 2021-11-02: qty 2

## 2021-11-02 MED ORDER — PROMETHAZINE HCL 12.5 MG PO TABS: 12.5000 mg | ORAL_TABLET | Freq: Four times a day (QID) | ORAL | 0 refills | Status: AC | PRN

## 2021-11-02 MED ORDER — POTASSIUM CHLORIDE 10 MEQ/100ML IV SOLN
10.0000 meq | Freq: Once | INTRAVENOUS | Status: AC
  Administered 2021-11-02: 10 meq via INTRAVENOUS
  Filled 2021-11-02: qty 100

## 2021-11-02 MED ORDER — SODIUM CHLORIDE 0.9 % IV SOLN
1.0000 g | Freq: Once | INTRAVENOUS | Status: AC
  Administered 2021-11-02: 1 g via INTRAVENOUS
  Filled 2021-11-02: qty 10

## 2021-11-02 MED ORDER — DOXYLAMINE SUCCINATE (SLEEP) 25 MG PO TABS
25.0000 mg | ORAL_TABLET | ORAL | Status: AC
Start: 2021-11-02 — End: 2021-11-02
  Administered 2021-11-02: 25 mg via ORAL
  Filled 2021-11-02: qty 1

## 2021-11-02 MED ORDER — ONDANSETRON HCL 4 MG PO TABS: 4.0000 mg | ORAL_TABLET | Freq: Three times a day (TID) | ORAL | 0 refills | Status: AC | PRN

## 2021-11-02 MED ORDER — POTASSIUM CHLORIDE CRYS ER 20 MEQ PO TBCR
40.0000 meq | EXTENDED_RELEASE_TABLET | Freq: Two times a day (BID) | ORAL | Status: DC
Start: 2021-11-02 — End: 2021-11-03
  Administered 2021-11-02 (×2): 40 meq via ORAL
  Filled 2021-11-02: qty 2

## 2021-11-02 MED ORDER — LACTATED RINGERS IV BOLUS
1000.0000 mL | Freq: Once | INTRAVENOUS | Status: AC
  Administered 2021-11-02: 1000 mL via INTRAVENOUS

## 2021-11-02 MED ORDER — ONDANSETRON HCL 4 MG/2ML IJ SOLN
4.0000 mg | Freq: Once | INTRAMUSCULAR | Status: AC
  Administered 2021-11-02: 4 mg via INTRAVENOUS
  Filled 2021-11-02: qty 2

## 2021-11-02 MED ORDER — POTASSIUM CHLORIDE 20 MEQ PO PACK
40.0000 meq | PACK | Freq: Once | ORAL | Status: DC
  Filled 2021-11-02: qty 2

## 2021-11-02 NOTE — ED Notes (Signed)
First nurse note-pt brought in via ems from home with n/ for 2 days.  Pt is approx [redacted] weeks pregnant.  Bp 115/72, p 70, oxygen sats 99% per ems.  Pt in a wheelchair in lobby.   Pt alert.

## 2021-11-02 NOTE — ED Triage Notes (Signed)
Pt presents to ED with N/V, pt is [redacted] weeks pregnant, first pregnancy.

## 2021-11-02 NOTE — ED Provider Triage Note (Signed)
Emergency Medicine Provider Triage Evaluation Note  Laurie Rosales , a 26 y.o. female G1, P0 was evaluated in triage.  Pt complains of acute nausea and vomiting that started over the past 24 hours.  Patient states that her symptoms started with a mild headache and feeling flushed.  Patient states that she feels weak.  She endorses some generalized abdominal discomfort since vomiting but no specific pain.  Patient is currently [redacted] weeks pregnant with her first child.  She denies chest pain, chest tightness or shortness of breath.  No vaginal bleeding.  Review of Systems  Positive: Patient has abdominal discomfort and nausea.  Negative: No vomiting or diarrhea.   Physical Exam  BP 119/65 (BP Location: Right Arm)   Pulse 96   Temp 97.8 F (36.6 C) (Oral)   Resp 18   LMP 06/28/2021 (Exact Date)   SpO2 100%  Gen:   Awake, no distress   Resp:  Normal effort  MSK:   Moves extremities without difficulty  Other:    Medical Decision Making  Medically screening exam initiated at 5:42 PM.  Appropriate orders placed.  Laurie Rosales was informed that the remainder of the evaluation will be completed by another provider, this initial triage assessment does not replace that evaluation, and the importance of remaining in the ED until their evaluation is complete.     Laurie Rosales, [place] 11/02/21 1747

## 2021-11-02 NOTE — ED Provider Notes (Signed)
[facility] Provider Note    Event Date/Time   First MD Initiated Contact with Patient 11/02/21 1824     (approximate)   History   Nausea and Emesis   HPI  Laurie Rosales is a 26 y.o. female  G1P0 at approximately 17 weeks presents for evaluation of some persistent nonbloody nonbilious vomiting and some intermittent abdominal cramping when she is vomiting.  She does not have any abdominal pain when she is not vomiting.  She denies any constipation or diarrhea or burning with urination or other abnormal bleeding or discharge.  No cough, chest pain, headache, earache, sore throat or fevers.  Patient states she ran out of all her nausea medicines including Diclegis Zofran and Phenergan that were prescribed at her OB/GYN.  She states she also has not been taking antibiotic that was prescribed for UTI because she has been too nauseous.  No recent trauma or injuries and she denies taking any other medications.  She denies tobacco abuse EtOH use or illicit drug use.  History reviewed. No pertinent past medical history.       Physical Exam  Triage Vital Signs: ED Triage Vitals [11/02/21 1735]  Enc Vitals Group     BP 119/65     Pulse Rate 96     Resp 18     Temp 97.8 F (36.6 C)     Temp Source Oral     SpO2 100 %     Weight      Height      Head Circumference      Peak Flow      Pain Score      Pain Loc      Pain Edu?      Excl. in GC?     Most recent vital signs: Vitals:   11/02/21 1735  BP: 119/65  Pulse: 96  Resp: 18  Temp: 97.8 F (36.6 C)  SpO2: 100%    General: Awake, no distress.  CV:  Good peripheral perfusion.  2+ radial pulses.  Very slight systolic murmur. Resp:  Normal effort.  Clear bilaterally. Abd:  No distention.  Soft throughout.  No CVA tenderness. Other:  Mucous membranes.   ED Results / Procedures / Treatments  Labs (all labs ordered are listed, but only abnormal results are displayed) Labs Reviewed  CBC WITH  DIFFERENTIAL/PLATELET - Abnormal; Notable for the following components:      Result Value   WBC 13.5 (*)    Neutro Abs 10.8 (*)    All other components within normal limits  COMPREHENSIVE METABOLIC PANEL - Abnormal; Notable for the following components:   Potassium 3.1 (*)    Glucose, Bld 123 (*)    All other components within normal limits  HCG, QUANTITATIVE, PREGNANCY - Abnormal; Notable for the following components:   hCG, Beta Chain, Quant, S 14,444 (*)    All other components within normal limits  URINALYSIS, ROUTINE W REFLEX MICROSCOPIC - Abnormal; Notable for the following components:   Color, Urine AMBER (*)    APPearance CLOUDY (*)    Specific Gravity, Urine 1.033 (*)    Glucose, UA 50 (*)    Ketones, ur 80 (*)    Protein, ur 100 (*)    Leukocytes,Ua MODERATE (*)    Bacteria, UA RARE (*)    All other components within normal limits  POC URINE PREG, ED - Abnormal; Notable for the following components:   Preg Test, Ur POSITIVE (*)  All other components within normal limits  LIPASE, BLOOD  MAGNESIUM     EKG    RADIOLOGY    PROCEDURES:  Critical Care performed: No  Procedures    MEDICATIONS ORDERED IN ED: Medications  lactated ringers bolus 1,000 mL (has no administration in time range)  pantoprazole (PROTONIX) injection 40 mg (has no administration in time range)  doxylamine (Sleep) (UNISOM) tablet 25 mg (has no administration in time range)  cefTRIAXone (ROCEPHIN) 1 g in sodium chloride 0.9 % 100 mL IVPB (has no administration in time range)  potassium chloride (KLOR-CON) packet 40 mEq (has no administration in time range)  pyridOXINE (VITAMIN B-6) tablet 100 mg (has no administration in time range)  ondansetron (ZOFRAN) injection 4 mg (4 mg Intravenous Given 11/02/21 1849)     IMPRESSION / MDM / ASSESSMENT AND PLAN / ED COURSE  I reviewed the triage vital signs and the nursing notes. Patient's presentation is most consistent with acute presentation  with potential threat to life or bodily function.                               Differential diagnosis includes, but is not limited to assess related to pregnancy, dehydration, electrolyte derangements, UTI, cholecystitis possible gastritis.  Her abdomen is soft there is no significant lower pain abdominal pain to suggest appendicitis or torsion.  I was able to confirm patient has a documented IUP from ultrasound obtained on 6/15 that I reviewed the interpretation of that showed early IUP.  CMP shows a K of 3.1 I suspect related to her vomiting without evidence of other significant lecture light or metabolic derangements.  Normal LFTs and no evidence of acute hepatitis or cholestatic process.  Lipase WNL not suggestive of pancreatitis.  CBC shows WC count 13.5 possibly reactive in the setting of nausea and vomiting versus from an acute infection.  Normal hemoglobin and platelets.  hCG 2000 4044.  UA shows evidence of infection with moderate leukocyte esterase, 50 WBCs and rare bacteria although he does appear also contaminated with 21-50 squamous epithelial cells.  Magnesium is 1.9.  Patient is feeling much better after above noted medications.  She is able tolerate p.o. without vomiting.  I refilled her Zofran Phenergan and Diclegis and change her previously prescribed Keflex for UTI to solution which hopefully she will tolerate better.  She was given a dose of Rocephin and will cover her until she get this tomorrow morning.  Also we will give a couple days of supplemental potassium and advised her to have this rechecked in 2 or 3 days by her OB/GYN.  Discussed returning for any new or worsening of symptoms.  Discharged in stable condition.  Strict return precautions advised and discussed.        FINAL CLINICAL IMPRESSION(S) / ED DIAGNOSES   Final diagnoses:  Nausea/vomiting in pregnancy  Acute cystitis without hematuria  Hypokalemia  Dehydration     Rx / DC Orders   ED Discharge  Orders     None        Note:  This document was prepared using Dragon voice recognition software and may include unintentional dictation errors.   Gilles Chiquito, MD 11/02/21 2032

## 2022-12-31 ENCOUNTER — Emergency Department
Admission: EM | Admit: 2022-12-31 | Discharge: 2022-12-31 | Disposition: A | Discharge status: Home / Self Care | Payer: Medicaid Other | Attending: Emergency Medicine | Admitting: Emergency Medicine

## 2022-12-31 DIAGNOSIS — R059 Cough, unspecified: Secondary | ICD-10-CM | POA: Diagnosis present

## 2022-12-31 DIAGNOSIS — Z20822 Contact with and (suspected) exposure to covid-19: Secondary | ICD-10-CM | POA: Diagnosis not present

## 2022-12-31 DIAGNOSIS — J069 Acute upper respiratory infection, unspecified: Secondary | ICD-10-CM | POA: Diagnosis not present

## 2022-12-31 LAB — RESP PANEL BY RT-PCR (RSV, FLU A&B, COVID)  RVPGX2
Influenza A by PCR: NEGATIVE
Influenza B by PCR: NEGATIVE
Resp Syncytial Virus by PCR: NEGATIVE
SARS Coronavirus 2 by RT PCR: NEGATIVE

## 2022-12-31 LAB — GROUP A STREP BY PCR: Group A Strep by PCR: NOT DETECTED

## 2022-12-31 MED ORDER — DEXAMETHASONE 4 MG PO TABS
4.0000 mg | ORAL_TABLET | Freq: Once | ORAL | Status: AC
  Administered 2022-12-31: 4 mg via ORAL
  Filled 2022-12-31: qty 1

## 2022-12-31 NOTE — ED Provider Notes (Signed)
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  Patient works at [employer].  Multiple coworkers have been sick with viral symptoms.  She took a COVID test that was negative.  Presents today as she is not sure what is causing her symptoms.  No fevers.  No difficulty breathing or chest pain.  Tolerating oral intake.     Physical Exam   Triage Vital Signs: ED Triage Vitals  Encounter Vitals Group     BP 12/31/22 1614 (!) 159/97     Systolic BP Percentile --      Diastolic BP Percentile --      Pulse Rate 12/31/22 1614 83     Resp 12/31/22 1614 19     Temp 12/31/22 1614 98.5 F (36.9 C)     Temp Source 12/31/22 1614 Oral     SpO2 12/31/22 1614 100 %     Weight --      Height --      Head Circumference --      Peak Flow --      Pain Score 12/31/22 1613 8     Pain Loc --      Pain Education --      Exclude from Growth Chart --     Most recent vital signs: Vitals:   12/31/22 1614  BP: (!) 159/97  Pulse: 83  Resp: 19  Temp: 98.5 F (36.9 C)  SpO2: 100%     General: Awake, interactive  HEENT: Mild erythema of the posterior oropharynx without swelling or exudates, no swelling underneath the tongue CV:  Regular rate, good peripheral perfusion.  Resp:  Lungs clear, unlabored respirations.  Abd:  Soft, nondistended.  Neuro:  Symmetric facial movement, fluid speech   ED Results / Procedures / Treatments   Labs (all labs ordered are listed, but only abnormal results are displayed) Labs Reviewed  GROUP A STREP BY PCR  RESP PANEL BY RT-PCR (RSV, FLU A&B, COVID)   [id number]     EKG EKG independently reviewed interpreted by myself (ER attending) demonstrates:    RADIOLOGY Imaging independently reviewed and interpreted by myself demonstrates:    PROCEDURES:  Critical Care performed: No  Procedures   MEDICATIONS ORDERED IN ED: Medications  dexamethasone (DECADRON) tablet 4 mg (4 mg Oral Given 12/31/22 1648)     IMPRESSION / MDM / ASSESSMENT AND PLAN / ED COURSE  I reviewed the triage vital signs and the nursing notes.  Differential diagnosis includes, but is not limited to, viral illness, low suspicion for pneumonia given clear lung sounds, normal oxygen saturation, no fever, consideration for strep pharyngitis  Patient's presentation is most consistent with acute, uncomplicated illness.  27 year old female presenting to the emergency department for evaluation of cough and congestion.  Vital signs stable.  Well-appearing on exam.  Viral swab and strep swab negative.  Discussed likely viral illness.  Discussed abortive care.  Strict return precautions provided.  Patient discharged stable condition.      FINAL CLINICAL IMPRESSION(S) / ED DIAGNOSES   Final diagnoses:  Viral upper respiratory  tract infection     Rx / DC Orders   ED Discharge Orders     None        Note:  This document was prepared using Dragon voice recognition software and may include unintentional dictation errors.   Trinna Post, MD 12/31/22 [phone]

## 2022-12-31 NOTE — ED Triage Notes (Signed)
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

## 2022-12-31 NOTE — Discharge Instructions (Signed)
You are seen in the ER for cough and congestion.  Your strep and viral swab were negative.  You can use Tylenol and ibuprofen as needed for body aches or if you develop a fever.  Return to the ER for new or worsening symptoms.

## 2023-01-02 NOTE — Group Note (Deleted)

## 2023-02-27 IMAGING — US US OB < 14 WKS - US OB TV - US DOPPLER
1 series · 13 of 28 positions shown · non-contrast
Comparison: None.

CLINICAL DATA: Pelvic pain, pregnant, LMP 06/28/2021



[Series 1: us ob less than 14 weeks w/ ob transvaginal and do · 13 of 102 slices shown]
[im 4/102]
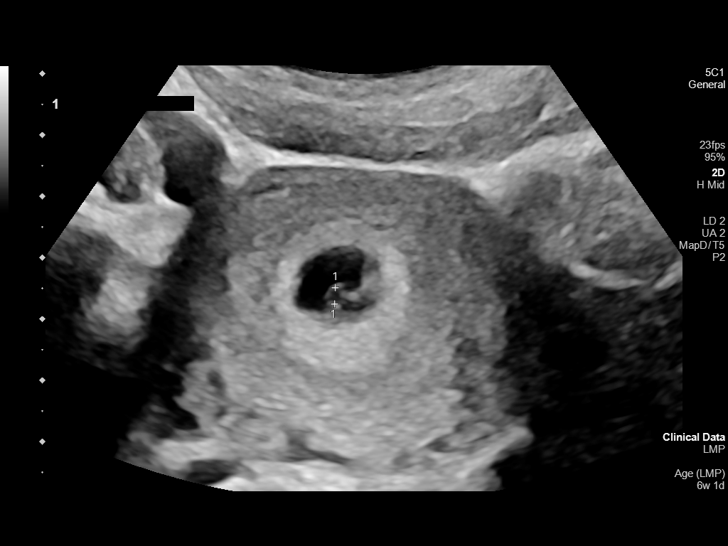
[im 12/102]
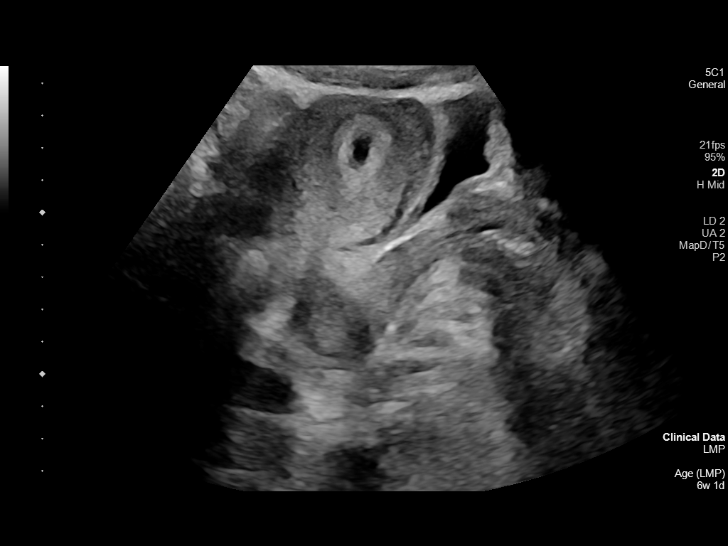
[im 19/102]
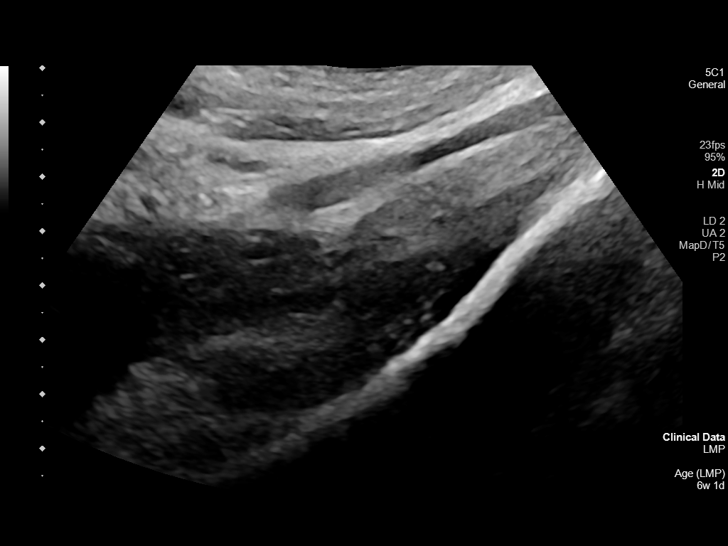
[im 27/102]
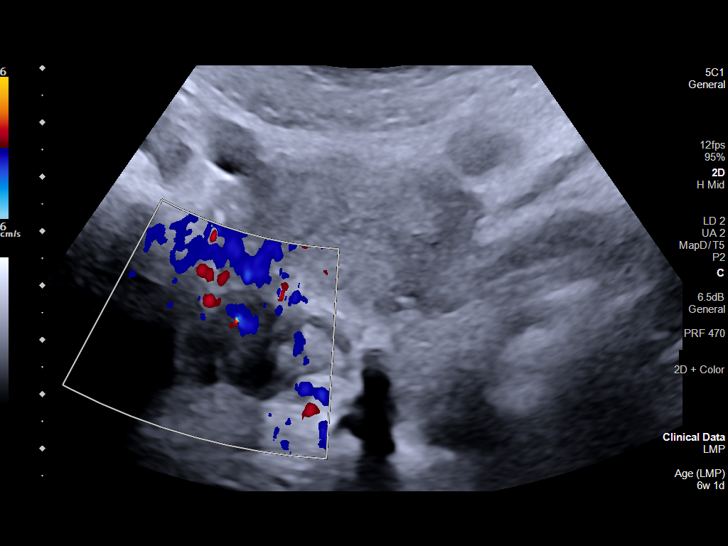
[im 34/102]
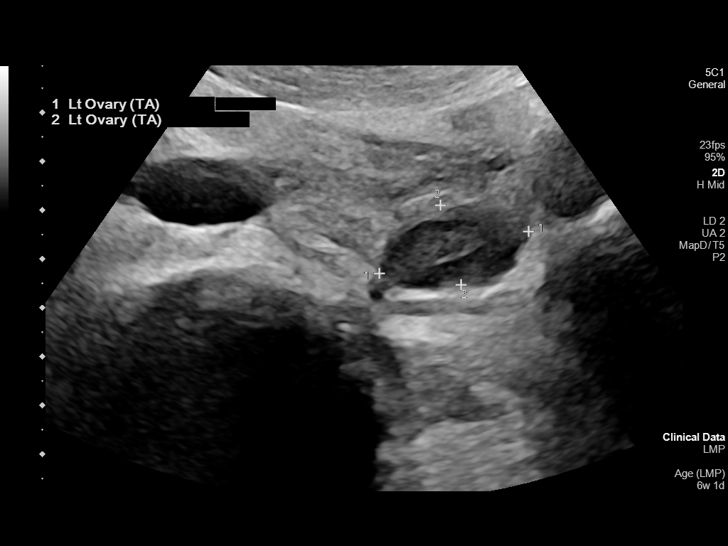
[im 42/102]
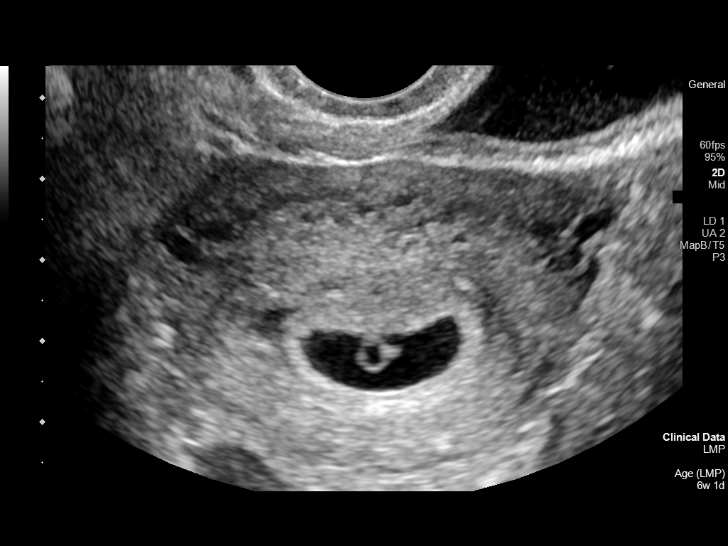
[im 53/102]
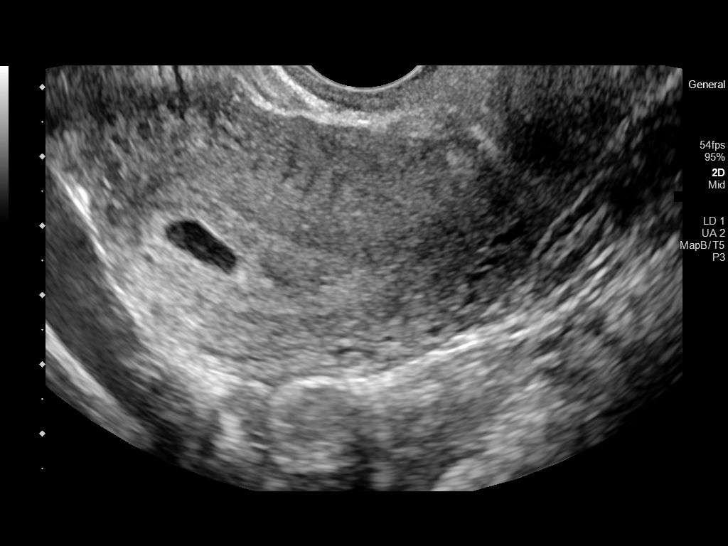
[im 60/102]
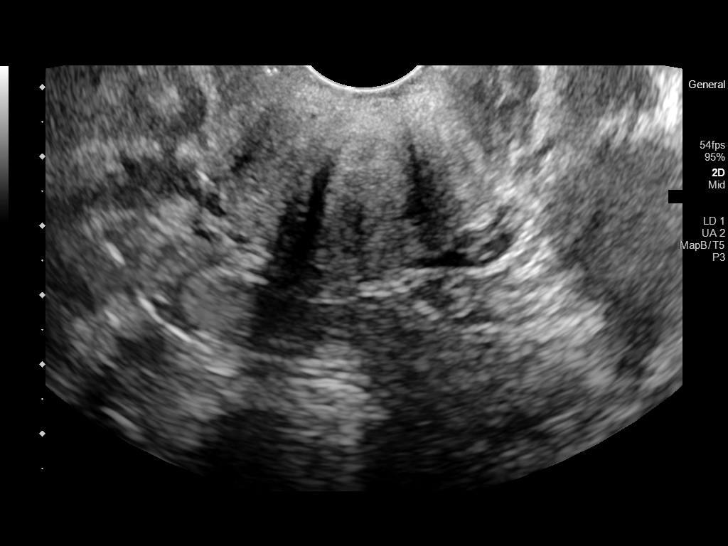
[im 68/102]
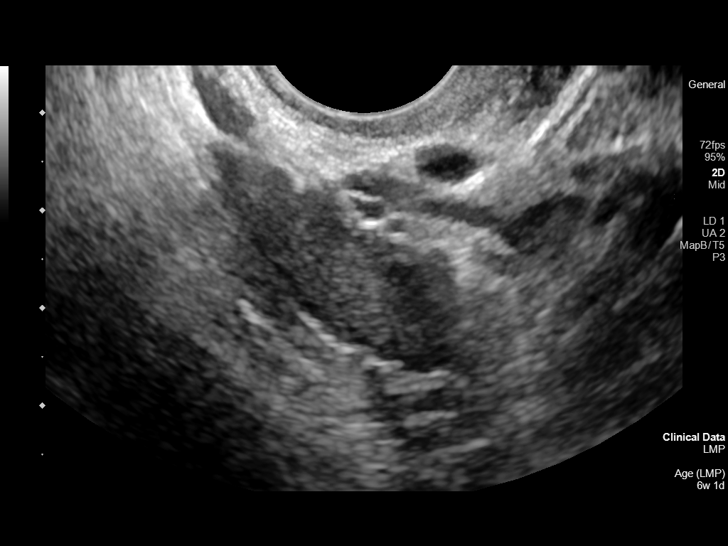
[im 75/102]
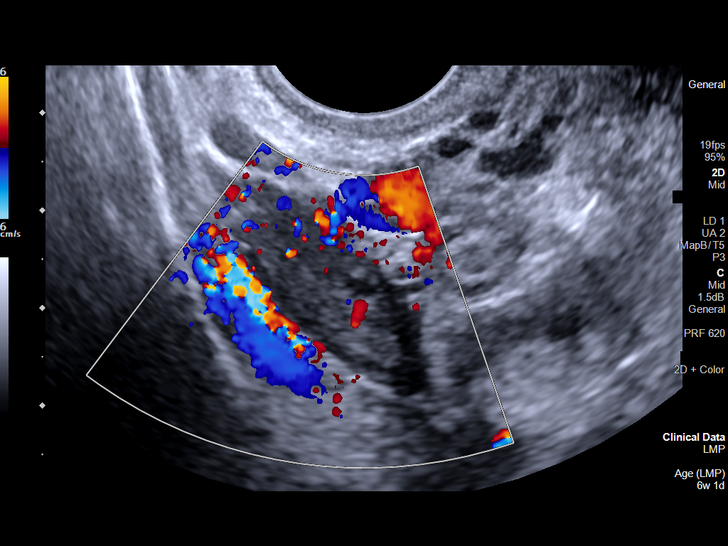
[im 83/102]
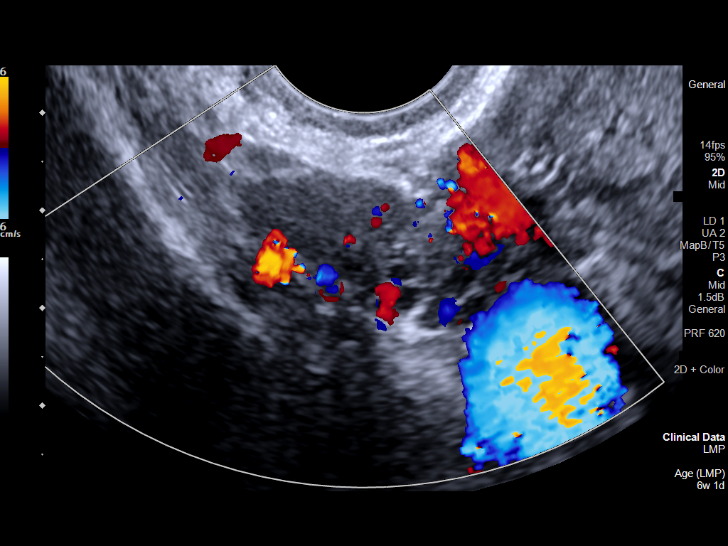
[im 90/102]
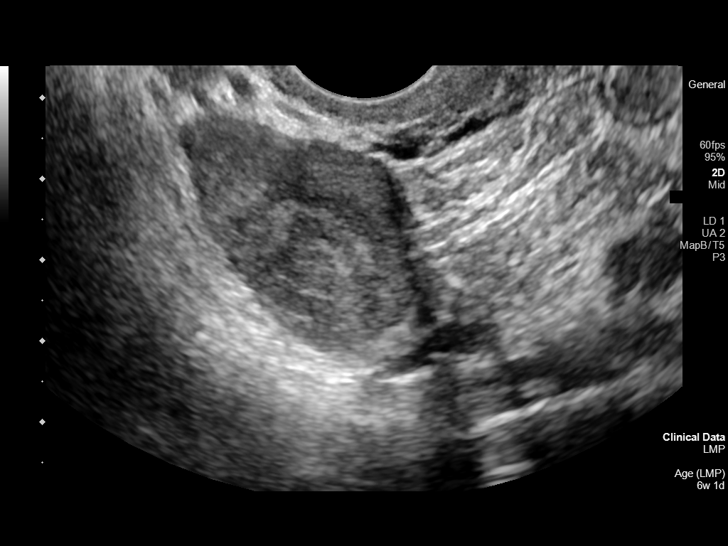
[im 98/102]
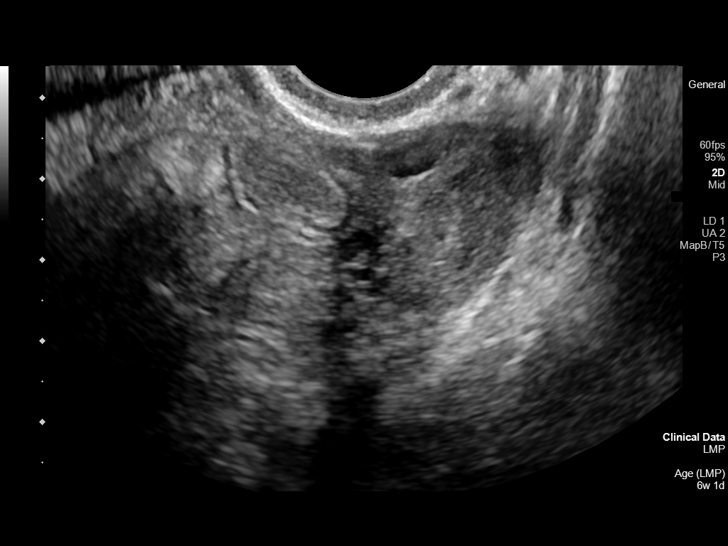

[13 of 28 positions shown; findings below may reference images not displayed]

FINDINGS: Intrauterine gestational sac: Single

Yolk sac:  Present, single, normal appearing

Embryo:  Present, single

Cardiac Activity: Present, regular

Heart Rate: 95 bpm

CRL:   3 mm   5 w 6 d                  US EDC: 04/06/2022

Subchorionic hemorrhage:  None visualized.

Maternal uterus/adnexae: The uterus is anteverted. The cervix is
unremarkable. No intrauterine masses are seen. The maternal ovaries
are normal in size and echogenicity. Corpus luteum noted within the
left ovary. No adnexal masses are seen. Mild simple appearing free
fluid is seen within the cul-de-sac.

Pulsed Doppler evaluation of both ovaries demonstrates normal
appearing low-resistance arterial and venous waveforms.
IMPRESSION: Single living intrauterine gestation with an estimated gestational
age of 5 weeks, 6 days.

Normal vascularity of the maternal ovaries bilaterally.

No acute abnormality.

## 2023-04-19 IMAGING — US US OB COMP LESS 14 WK
1 series · 14 of 25 positions shown · non-contrast
Comparison: None Available.

Ultrasound 08/10/2021

CLINICAL DATA: Pain

EXAM:
OBSTETRIC <14 WK US AND TRANSVAGINAL OB US
TECHNIQUE: Both transabdominal and transvaginal ultrasound examinations were
performed for complete evaluation of the gestation as well as the
maternal uterus, adnexal regions, and pelvic cul-de-sac.
Transvaginal technique was performed to assess early pregnancy.

[Series 1: us ob less than 14 weeks with ob transvaginal · 14 of 25 slices shown]
[im 1/25]
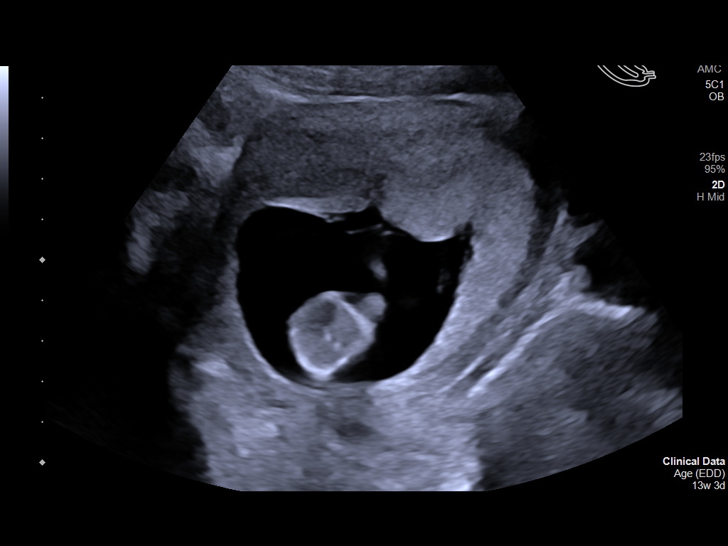
[im 3/25]
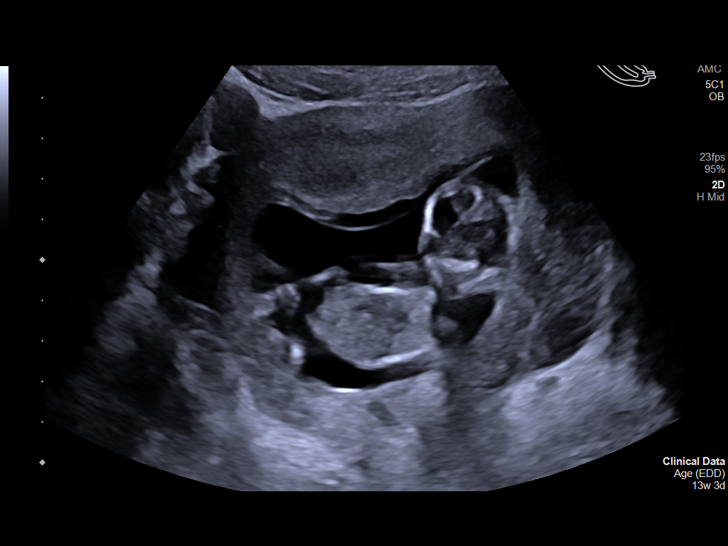
[im 5/25]
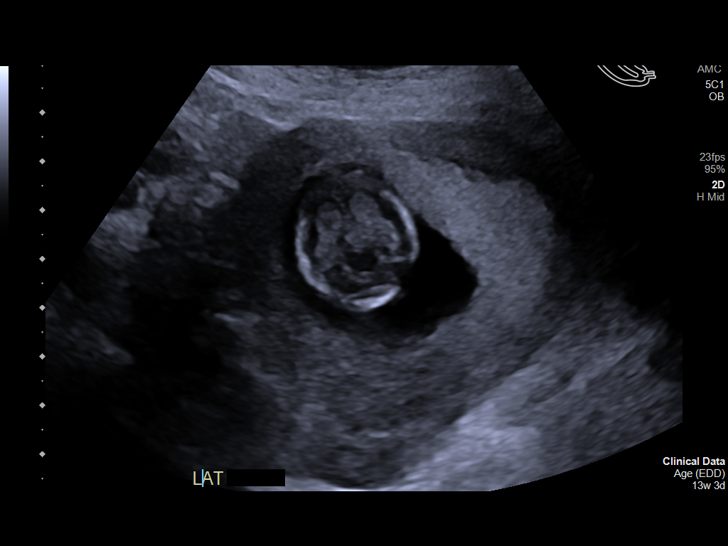
[im 7/25]
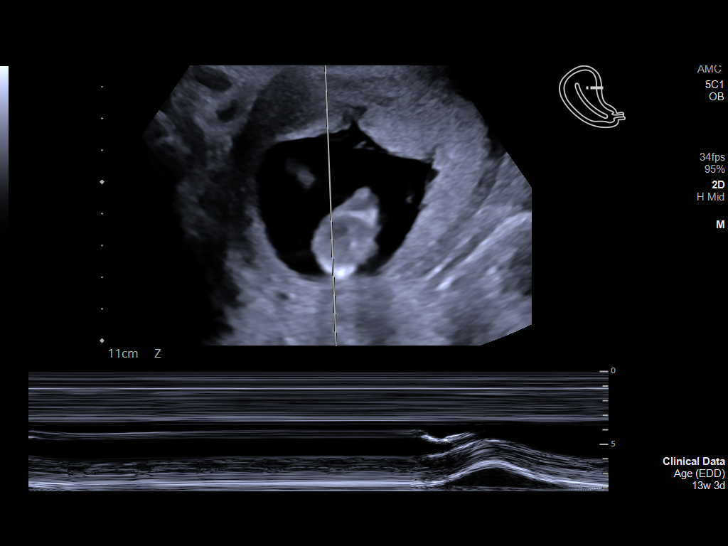
[im 9/25]
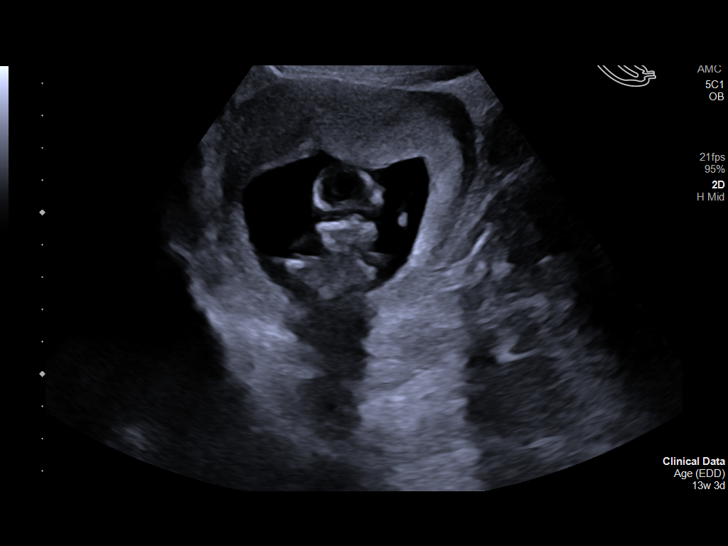
[im 10/25]
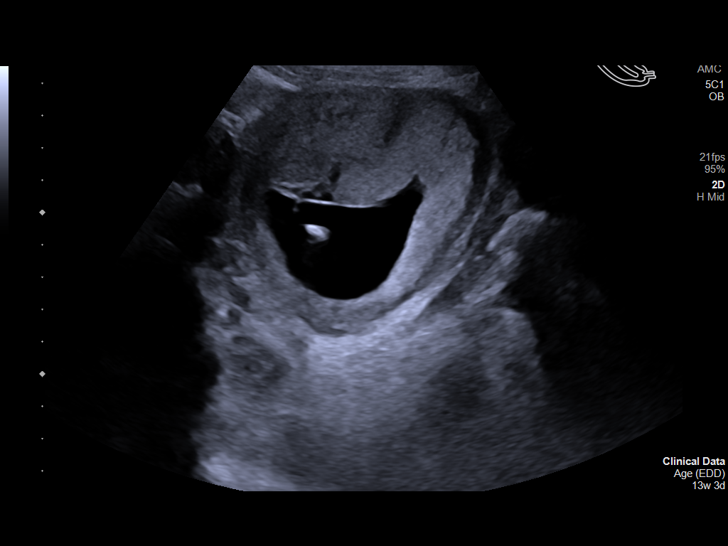
[im 12/25]
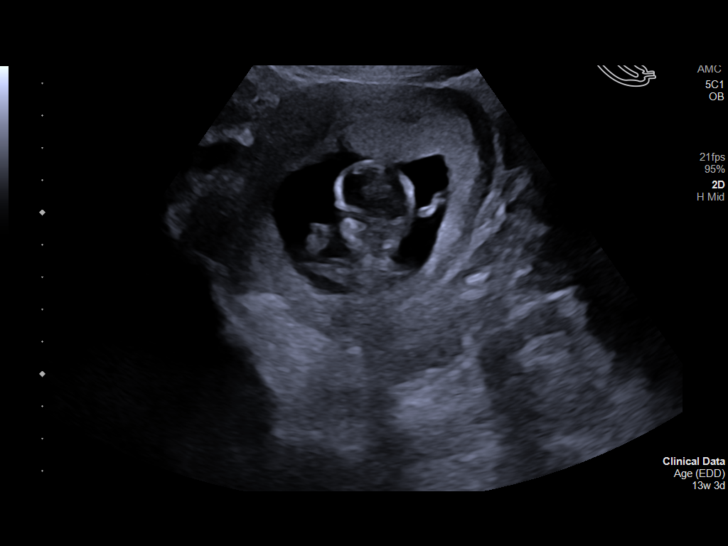
[im 14/25]
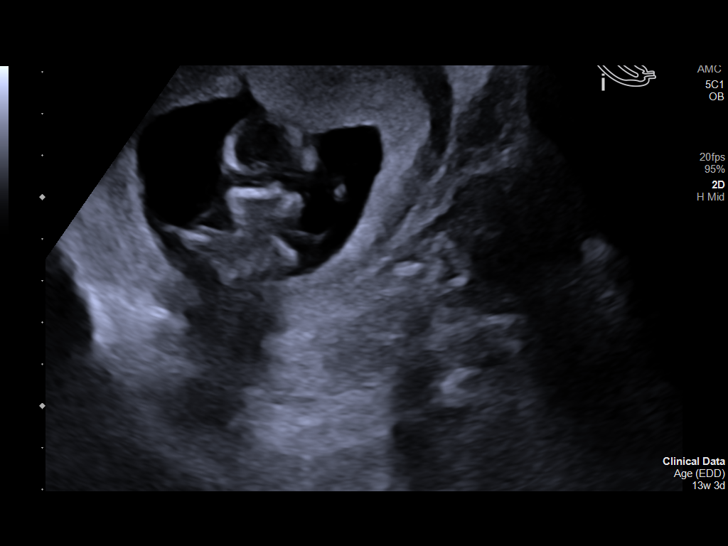
[im 16/25]
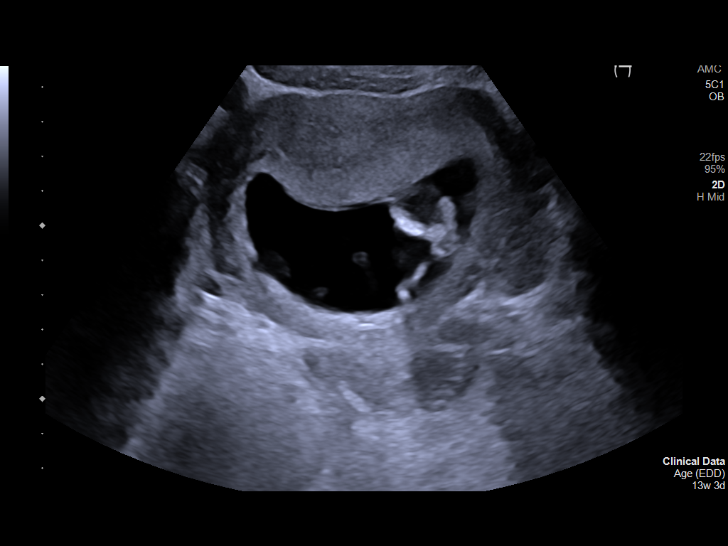
[im 17/25]
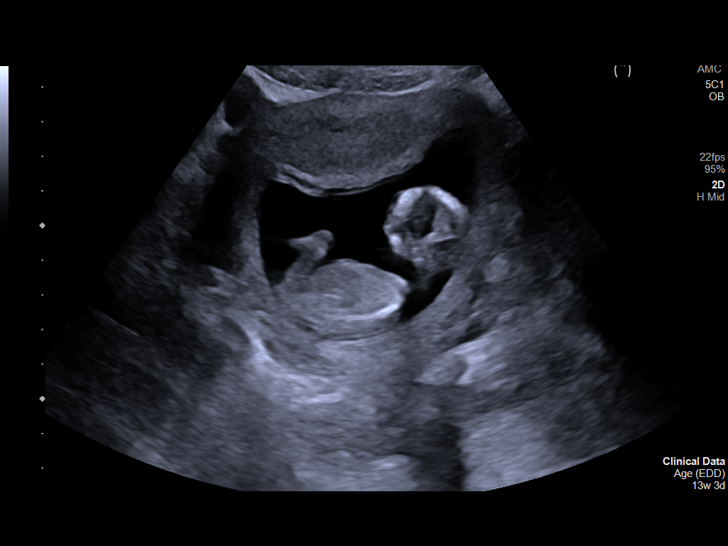
[im 19/25]
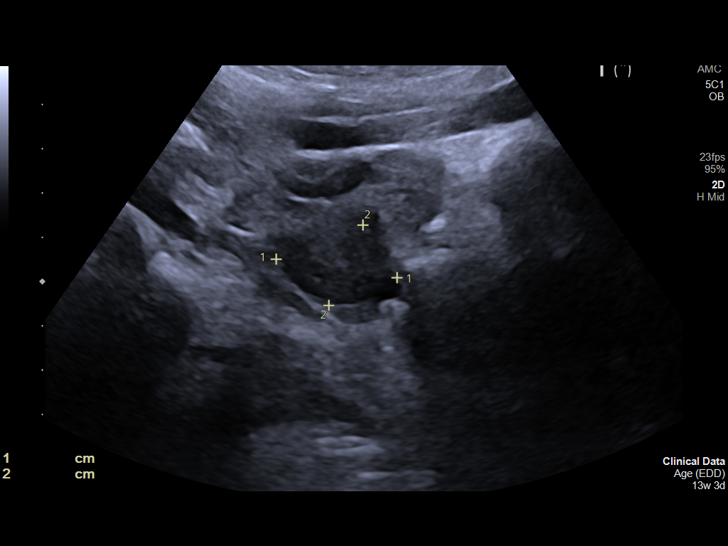
[im 21/25]
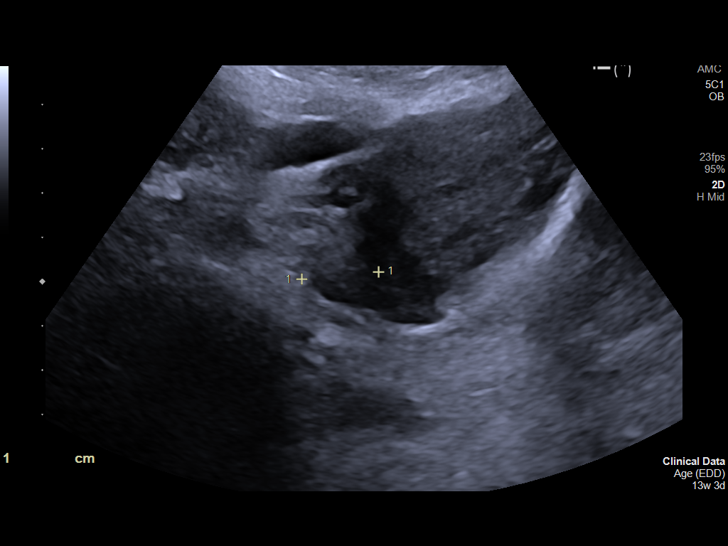
[im 23/25]
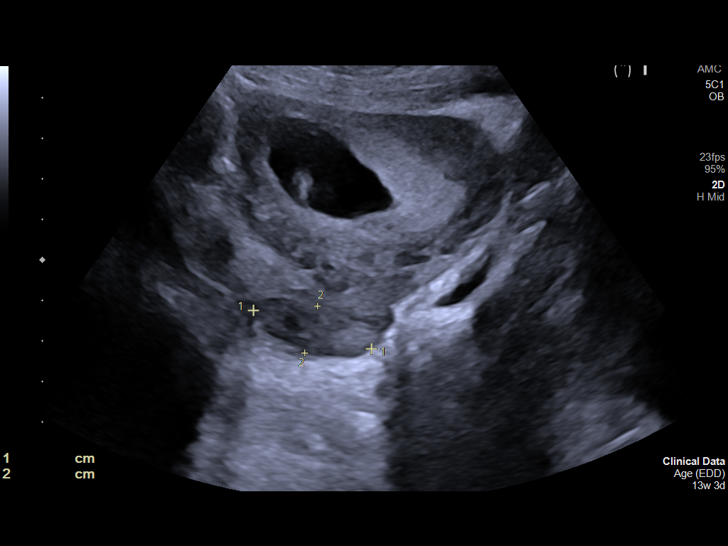
[im 25/25]
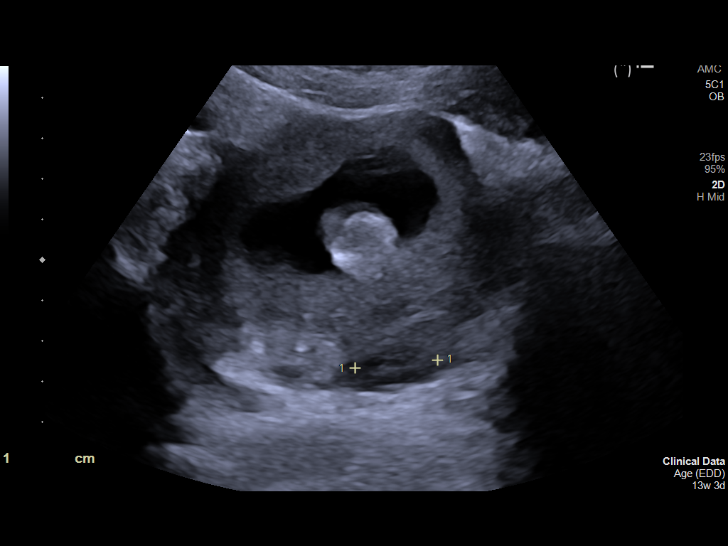

[14 of 25 positions shown; findings below may reference images not displayed]

FINDINGS: Single intrauterine pregnancy.

Cardiac Activity: Visualized

Heart Rate: 155 bpm

CRL:  73.3 mm   13 w   3 d                  US EDC: 04/04/2022

Subchorionic hemorrhage:  None visualized.

Maternal uterus/adnexa: Normal ovaries.

Other: No free fluid.
IMPRESSION: Single viable intrauterine pregnancy. Estimated gestational age 13
weeks 3 days.

This exam is performed on an emergent basis and does not
comprehensively evaluate fetal size, dating, or anatomy; follow-up
complete OB US should be considered if further fetal assessment is
warranted.

## 2023-08-07 ENCOUNTER — Other Ambulatory Visit: Payer: Self-pay

## 2023-08-07 ENCOUNTER — Emergency Department
Admission: EM | Admit: 2023-08-07 | Discharge: 2023-08-07 | Disposition: A | Discharge status: Home / Self Care | Attending: Emergency Medicine | Admitting: Emergency Medicine

## 2023-08-07 DIAGNOSIS — K625 Hemorrhage of anus and rectum: Secondary | ICD-10-CM | POA: Insufficient documentation

## 2023-08-07 LAB — COMPREHENSIVE METABOLIC PANEL WITH GFR
ALT: 13 U/L (ref 0–44)
AST: 16 U/L (ref 15–41)
Albumin: 4.5 g/dL (ref 3.5–5.0)
Alkaline Phosphatase: 50 U/L (ref 38–126)
Anion gap: 7 (ref 5–15)
BUN: 17 mg/dL (ref 6–20)
CO2: 26 mmol/L (ref 22–32)
Calcium: 8.9 mg/dL (ref 8.9–10.3)
Chloride: 105 mmol/L (ref 98–111)
Creatinine, Ser: 0.69 mg/dL (ref 0.44–1.00)
GFR, Estimated: >60 mL/min (ref >60)
Glucose, Bld: 88 mg/dL (ref 70–99)
Potassium: 3.6 mmol/L (ref 3.5–5.1)
Sodium: 138 mmol/L (ref 135–145)
Total Bilirubin: 0.4 mg/dL (ref 0.0–1.2)
Total Protein: 7.6 g/dL (ref 6.5–8.1)

## 2023-08-07 LAB — CBC
HCT: 38.1 % (ref 36.0–46.0)
Hemoglobin: 12.7 g/dL (ref 12.0–15.0)
MCH: 28.7 pg (ref 26.0–34.0)
MCHC: 33.3 g/dL (ref 30.0–36.0)
MCV: 86 fL (ref 80.0–100.0)
Platelets: 256 10*3/uL (ref 150–400)
RBC: 4.43 MIL/uL (ref 3.87–5.11)
RDW: 12.2 % (ref 11.5–15.5)
WBC: 6.1 10*3/uL (ref 4.0–10.5)
nRBC: 0 % (ref 0.0–0.2)

## 2023-08-07 LAB — URINALYSIS, ROUTINE W REFLEX MICROSCOPIC
Bilirubin Urine: NEGATIVE
Glucose, UA: NEGATIVE mg/dL
Hgb urine dipstick: NEGATIVE
Ketones, ur: NEGATIVE mg/dL
Leukocytes,Ua: NEGATIVE
Nitrite: NEGATIVE
Protein, ur: NEGATIVE mg/dL
Specific Gravity, Urine: 1.029 (ref 1.005–1.030)
pH: 5 (ref 5.0–8.0)

## 2023-08-07 LAB — POC URINE PREG, ED: Preg Test, Ur: NEGATIVE

## 2023-08-07 LAB — LIPASE, BLOOD: Lipase: 31 U/L (ref 11–51)

## 2023-08-07 MED ORDER — DOCUSATE SODIUM 100 MG PO CAPS: 100.0000 mg | ORAL_CAPSULE | Freq: Two times a day (BID) | ORAL | 0 refills | Status: AC

## 2023-08-07 NOTE — ED Triage Notes (Signed)
 Pt comes with rectal bleeding for two days. Pt states her uterus hurts and she has been cramping. Pt denies any known injuries or hx of hemorrhoids.

## 2023-08-07 NOTE — Discharge Instructions (Addendum)
 You are seen in the emergency department for rectal bleeding.  Concerned that this is from internal hemorrhoids.  Start a stool softener.  Make sure that you are eating a high-fiber diet.  You can do sitz bath's multiple times a day to help with any pain.  You are given information of how to take a sitz bath's.  Follow-up closely with your primary care provider.  Return to the emergency department for any worsening pain, bleeding, shortness of breath or lightheadedness.

## 2023-08-07 NOTE — ED Notes (Signed)
 Pt not in room from discharge

## 2023-08-07 NOTE — ED Provider Notes (Signed)
 [facility] Provider Note    Event Date/Time   First MD Initiated Contact with Patient 08/07/23 1122     (approximate)   History   Rectal Bleeding   HPI  Laurie Rosales is a 28 y.o. female no significant past medical history who presents to the emergency department with rectal bleeding.  Patient states that she noted that she was having some dark red blood after having a bowel movement and in the toilet.  States that whenever she wiped she initially thought that it was vaginal bleeding but then noted that it was rectal bleeding.  Denies any history of the same.  Not on any blood thinners or take any any daily medications.  Prior pregnancy over 1 year ago.  No history of hemorrhoids.  Denies any pain with bowel movements.     Physical Exam   Triage Vital Signs: ED Triage Vitals  Encounter Vitals Group     BP 08/07/23 0956 (!) 140/77     Systolic BP Percentile --      Diastolic BP Percentile --      Pulse Rate 08/07/23 0956 87     Resp 08/07/23 0956 16     Temp 08/07/23 0956 98 F (36.7 C)     Temp Source 08/07/23 0956 Oral     SpO2 08/07/23 0956 100 %     Weight --      Height --      Head Circumference --      Peak Flow --      Pain Score 08/07/23 0958 3     Pain Loc --      Pain Education --      Exclude from Growth Chart --     Most recent vital signs: Vitals:   08/07/23 0956  BP: (!) 140/77  Pulse: 87  Resp: 16  Temp: 98 F (36.7 C)  SpO2: 100%    Physical Exam Exam conducted with a chaperone present.  Constitutional:      Appearance: She is well-developed.  HENT:     Head: Atraumatic.  Eyes:     Conjunctiva/sclera: Conjunctivae normal.  Cardiovascular:     Rate and Rhythm: Regular rhythm.  Pulmonary:     Effort: No respiratory distress.  Abdominal:     General: There is no distension.  Genitourinary:    Comments: Rectal exam no gross blood or melena.  No obvious external fissures or tears.  Questionable palpable  internal hemorrhoid. Musculoskeletal:        General: Normal range of motion.     Cervical back: Normal range of motion.  Skin:    General: Skin is warm.  Neurological:     Mental Status: She is alert. Mental status is at baseline.      IMPRESSION / MDM / ASSESSMENT AND PLAN / ED COURSE  I reviewed the triage vital signs and the nursing notes.  Differential diagnosis including GI bleed, lower GI bleed, internal hemorrhoids, fissure, tear   Labs (all labs ordered are listed, but only abnormal results are displayed) Labs interpreted as -    Labs Reviewed  URINALYSIS, ROUTINE W REFLEX MICROSCOPIC - Abnormal; Notable for the following components:      Result Value   Color, Urine YELLOW (*)    APPearance HAZY (*)    All other components within normal limits  POC URINE PREG, ED - Normal  LIPASE, BLOOD  COMPREHENSIVE METABOLIC PANEL WITH GFR  CBC   Pregnancy test is  negative.  No findings of urinary tract infection.  Hemoglobin is stable.  No findings concerning for an upper GI bleed.  Patient is otherwise well-appearing.  Most likely with internal hemorrhoid.  Discussed symptomatic treatment and close follow-up with primary care provider.  Discussed return to the emergency department for any ongoing or worsening symptoms.  No questions at time of discharge.      PROCEDURES:  Critical Care performed: No  Procedures  Patient's presentation is most consistent with acute complicated illness / injury requiring diagnostic workup.   MEDICATIONS ORDERED IN ED: Medications - No data to display  FINAL CLINICAL IMPRESSION(S) / ED DIAGNOSES   Final diagnoses:  Rectal bleeding     Rx / DC Orders   ED Discharge Orders          Ordered    docusate sodium (COLACE) 100 MG capsule  2 times daily        08/07/23 1155             Note:  This document was prepared using Dragon voice recognition software and may include unintentional dictation errors.   Viviano Ground,  MD 08/07/23 1215
